# Patient Record
Sex: Female | Born: 1955
Health system: Southern US, Community
[De-identification: ages and names within clinical notes are randomized; demographics above are authoritative.]

## PROBLEM LIST (undated history)

## (undated) DIAGNOSIS — G4700 Insomnia, unspecified: Secondary | ICD-10-CM

## (undated) DIAGNOSIS — K219 Gastro-esophageal reflux disease without esophagitis: Secondary | ICD-10-CM

## (undated) DIAGNOSIS — M8580 Other specified disorders of bone density and structure, unspecified site: Secondary | ICD-10-CM

## (undated) DIAGNOSIS — M810 Age-related osteoporosis without current pathological fracture: Secondary | ICD-10-CM

## (undated) DIAGNOSIS — N6009 Solitary cyst of unspecified breast: Secondary | ICD-10-CM

## (undated) DIAGNOSIS — K589 Irritable bowel syndrome without diarrhea: Secondary | ICD-10-CM

## (undated) DIAGNOSIS — F419 Anxiety disorder, unspecified: Secondary | ICD-10-CM

## (undated) HISTORY — DX: Other specified disorders of bone density and structure, unspecified site: M85.80

## (undated) HISTORY — DX: Insomnia, unspecified: G47.00

## (undated) HISTORY — DX: Solitary cyst of unspecified breast: N60.09

## (undated) HISTORY — DX: Anxiety disorder, unspecified: F41.9

## (undated) HISTORY — DX: Gastro-esophageal reflux disease without esophagitis: K21.9

## (undated) HISTORY — DX: Age-related osteoporosis without current pathological fracture: M81.0

## (undated) HISTORY — DX: Irritable bowel syndrome, unspecified: K58.9

---

## 1983-10-04 HISTORY — PX: TUBAL LIGATION: SHX77

## 1999-12-27 HISTORY — PX: BREAST BIOPSY: SHX20

## 2002-09-13 HISTORY — PX: COLPOSCOPY: SHX161

## 2005-05-25 ENCOUNTER — Ambulatory Visit: Payer: Self-pay | Admitting: Internal Medicine

## 2005-06-09 ENCOUNTER — Ambulatory Visit: Payer: Self-pay | Admitting: Surgery

## 2005-06-21 ENCOUNTER — Ambulatory Visit: Payer: Self-pay | Admitting: Surgery

## 2005-09-28 ENCOUNTER — Ambulatory Visit: Payer: Self-pay | Admitting: Otolaryngology

## 2007-12-27 HISTORY — PX: COLONOSCOPY: SHX174

## 2008-03-17 ENCOUNTER — Ambulatory Visit: Payer: Self-pay | Admitting: Unknown Physician Specialty

## 2009-01-06 ENCOUNTER — Ambulatory Visit: Payer: Self-pay | Admitting: Unknown Physician Specialty

## 2009-03-26 HISTORY — PX: CHOLECYSTECTOMY: SHX55

## 2009-04-08 ENCOUNTER — Emergency Department: Payer: Self-pay | Admitting: Emergency Medicine

## 2009-04-16 ENCOUNTER — Ambulatory Visit: Payer: Self-pay | Admitting: General Surgery

## 2009-04-20 ENCOUNTER — Ambulatory Visit: Payer: Self-pay | Admitting: General Surgery

## 2010-01-19 ENCOUNTER — Ambulatory Visit: Payer: Self-pay | Admitting: Unknown Physician Specialty

## 2011-02-01 ENCOUNTER — Ambulatory Visit: Payer: Self-pay | Admitting: Unknown Physician Specialty

## 2011-12-28 ENCOUNTER — Ambulatory Visit: Payer: Self-pay | Admitting: Unknown Physician Specialty

## 2012-02-06 ENCOUNTER — Ambulatory Visit: Payer: Self-pay | Admitting: Unknown Physician Specialty

## 2014-01-15 ENCOUNTER — Ambulatory Visit: Payer: Self-pay | Admitting: Unknown Physician Specialty

## 2014-10-06 ENCOUNTER — Other Ambulatory Visit: Payer: Self-pay | Admitting: *Deleted

## 2014-10-06 ENCOUNTER — Encounter: Payer: Self-pay | Admitting: Podiatry

## 2014-10-06 ENCOUNTER — Ambulatory Visit (INDEPENDENT_AMBULATORY_CARE_PROVIDER_SITE_OTHER): Payer: BC Managed Care – PPO | Admitting: Podiatry

## 2014-10-06 ENCOUNTER — Ambulatory Visit (INDEPENDENT_AMBULATORY_CARE_PROVIDER_SITE_OTHER): Payer: BC Managed Care – PPO

## 2014-10-06 VITALS — BP 117/75 | HR 66 | Resp 16 | Ht 63.5 in | Wt 115.0 lb

## 2014-10-06 DIAGNOSIS — M205X9 Other deformities of toe(s) (acquired), unspecified foot: Secondary | ICD-10-CM

## 2014-10-06 DIAGNOSIS — M21629 Bunionette of unspecified foot: Secondary | ICD-10-CM

## 2014-10-06 DIAGNOSIS — M2010 Hallux valgus (acquired), unspecified foot: Secondary | ICD-10-CM

## 2014-10-06 DIAGNOSIS — L603 Nail dystrophy: Secondary | ICD-10-CM

## 2014-10-06 NOTE — Progress Notes (Signed)
   Subjective:    Patient ID: Susan QuanVirginia M Chaney, female    DOB: [birth year], 58 y.o.   MRN: [id number]  HPI Comments: I have two big bunions, also i would like to talk to about the laser treatment for nail fungus. I have dealt with fungus for years and used different therapies it would help and it would just come back .  My bunions have got worse over years and hurt with certain types of shoes      Review of Systems  All other systems reviewed and are negative.      Objective:   Physical Exam: Her past medical history medications allergies surgeries and social history. Pulses are strongly palpable bilateral. Neurologic sensorium is intact per Semmes-Weinstein monofilament. Degenerative flexor intact bilateral muscle strength is 5 over 5 dorsiflexors plantar flexors inverters everters all intrinsic musculature is intact. Orthopedic evaluation demonstrates all joints distal to the ankle a full range of motion without crepitation. She has moderate to severe hallux abductovalgus deformity right greater than left. With medial deviation of the toes. She has mild hammertoe deformities #2 bilateral. Radiographic evaluation demonstrates Taylor's bunion deformity with an increase in the fourth intermetatarsal angle as well as hallux abductovalgus deformity with increase in the first intermetatarsal angle both of these angle greater than normal values with abduction and abduction of the toes respectively. Cutaneous evaluation demonstrates supple well hydrated cutis thick yellow dystrophic possibly mycotic nails 12 and 3 bilateral.        Assessment & Plan:  Assessment: Nail dystrophy toes #1 toes #2 toe #3 bilateral. Hallux abductovalgus deformity right greater than left with pain. Taylor's bunion deformity right greater than left with pain.  Plan: Discussed etiology pathology conservative versus surgical therapies. I took a sample of the nail today do sent for pathologic evaluation. She was also  dispensed a Cam Walker today for postoperative. The consent form was signed today after we reviewed consisting of an Austin bunion repair right foot and a fifth metatarsal osteotomy with screw right foot I answered all the questions regarding his procedures to the best of my ability in layman's terms. We did discuss a possible postop complications which may include but are not limited to postop pain bleeding swelling infection need further surgery over correction under correction loss a digital also limb loss of life. I answered these to the best my ability. I will followup with her in the near future for surgical intervention.

## 2014-10-22 ENCOUNTER — Encounter: Payer: Self-pay | Admitting: Podiatry

## 2014-11-05 ENCOUNTER — Ambulatory Visit (INDEPENDENT_AMBULATORY_CARE_PROVIDER_SITE_OTHER): Payer: BC Managed Care – PPO | Admitting: Podiatry

## 2014-11-05 VITALS — BP 115/71 | HR 74 | Resp 16

## 2014-11-05 DIAGNOSIS — Z79899 Other long term (current) drug therapy: Secondary | ICD-10-CM

## 2014-11-05 MED ORDER — TERBINAFINE HCL 250 MG PO TABS: 250.0000 mg | ORAL_TABLET | Freq: Every day | ORAL | Status: DC

## 2014-11-05 NOTE — Progress Notes (Signed)
She presents today for her pathology results regarding her toenail fungus. He came back positive for a simple fungus. She states that she has had a history of loss of taste with the use of antifungal such as Lamisil. However she states that she cannot afford Sporanox.  Objective: Onychomycosis bilateral with with tinea pedis.  Assessment: Onychomycosis tinea pedis bilateral.  Plan: She is willing to take the risk to use Lamisil. Should she start to develop loss of taste she will discontinue that he medication. We will start her on Lamisil 250 mg tablets 1 by mouth daily and a liver profile was requested. Should this come back abnormal I will notify her otherwise I will see her in 1 month.

## 2014-12-03 ENCOUNTER — Ambulatory Visit: Payer: BC Managed Care – PPO | Admitting: Podiatry

## 2014-12-30 ENCOUNTER — Telehealth: Payer: Self-pay | Admitting: *Deleted

## 2014-12-30 NOTE — Telephone Encounter (Signed)
Pt states she would like to schedule surgery for 02/20/2015 with Dr. Al CorpusHyatt.

## 2015-02-18 ENCOUNTER — Other Ambulatory Visit: Payer: Self-pay | Admitting: Podiatry

## 2015-02-18 MED ORDER — CEPHALEXIN 500 MG PO CAPS: 500.0000 mg | ORAL_CAPSULE | Freq: Three times a day (TID) | ORAL | Status: DC

## 2015-02-18 MED ORDER — PROMETHAZINE HCL 25 MG PO TABS: 25.0000 mg | ORAL_TABLET | Freq: Three times a day (TID) | ORAL | Status: DC | PRN

## 2015-02-18 MED ORDER — OXYCODONE-ACETAMINOPHEN 10-325 MG PO TABS: ORAL_TABLET | ORAL | Status: DC

## 2015-02-20 ENCOUNTER — Encounter: Payer: Self-pay | Admitting: Podiatry

## 2015-02-20 DIAGNOSIS — M2011 Hallux valgus (acquired), right foot: Secondary | ICD-10-CM

## 2015-02-20 DIAGNOSIS — M21541 Acquired clubfoot, right foot: Secondary | ICD-10-CM

## 2015-02-25 ENCOUNTER — Ambulatory Visit (INDEPENDENT_AMBULATORY_CARE_PROVIDER_SITE_OTHER): Payer: BLUE CROSS/BLUE SHIELD

## 2015-02-25 ENCOUNTER — Ambulatory Visit (INDEPENDENT_AMBULATORY_CARE_PROVIDER_SITE_OTHER): Payer: BLUE CROSS/BLUE SHIELD | Admitting: Podiatry

## 2015-02-25 VITALS — BP 120/84 | HR 75 | Temp 96.3°F | Resp 16

## 2015-02-25 DIAGNOSIS — Z9889 Other specified postprocedural states: Secondary | ICD-10-CM

## 2015-02-25 DIAGNOSIS — M205X9 Other deformities of toe(s) (acquired), unspecified foot: Secondary | ICD-10-CM

## 2015-02-25 DIAGNOSIS — M2011 Hallux valgus (acquired), right foot: Secondary | ICD-10-CM | POA: Diagnosis not present

## 2015-02-25 DIAGNOSIS — M21629 Bunionette of unspecified foot: Secondary | ICD-10-CM

## 2015-02-26 NOTE — Progress Notes (Signed)
She presents today 1 week status post ostomy repair right fifth metatarsal osteotomy right date of surgery was 02/20/2015. She states that it feels pretty good but it's a little swollen.  Objective: Vital signs are stable she is alert and oriented 3 presents today in her Cam Walker. Once removed in this dressing dry sterile dressing intact once removed demonstrates moderate edema overlying the midfoot and forefoot with some irritation of the skin around the ankle secondary to dressing. She has good range of motion of the first metatarsophalangeal joint with some tenderness. I see no signs of infection and radiographically the osteotomies are in good position. Internal fixation is intact.  Assessment: Well-healing surgical toes status post Austin bunion repair and fifth metatarsal osteotomy right foot.  Plan: Redress today dressed press dressing follow-up with her in 1 week for suture removal if applicable. She will continue to keep this dry cleaning elevated.

## 2015-03-04 ENCOUNTER — Encounter: Payer: Self-pay | Admitting: Podiatry

## 2015-03-04 ENCOUNTER — Ambulatory Visit (INDEPENDENT_AMBULATORY_CARE_PROVIDER_SITE_OTHER): Payer: BLUE CROSS/BLUE SHIELD | Admitting: Podiatry

## 2015-03-04 VITALS — BP 118/71 | HR 65 | Resp 16

## 2015-03-04 DIAGNOSIS — Z9889 Other specified postprocedural states: Secondary | ICD-10-CM

## 2015-03-04 DIAGNOSIS — M205X1 Other deformities of toe(s) (acquired), right foot: Secondary | ICD-10-CM

## 2015-03-04 DIAGNOSIS — M2011 Hallux valgus (acquired), right foot: Secondary | ICD-10-CM | POA: Diagnosis not present

## 2015-03-04 NOTE — Progress Notes (Signed)
She presents today for her second postop visit [facility] fifth metatarsal osteotomy right foot. She states that the distalmost aspect of the incision site appears to be a little bit tender but all nausea and doing quite well.  Objective: Vital signs are stable she is alert and oriented 3. Pulses are strongly palpable bilateral. Neurologic sensorium is intact. Margins appear to be well coapted there is minimal edema and sutures were removed today. She has some anticipation about range of motion of the first metatarsophalangeal joint of the right foot.  Assessment: Well-healing surgical foot right.  Plan: Continue physical therapy daily at home and she may start washing this regularly and I will follow-up with her in 2 weeks. She was dispensed a Darco shoe today with compression anklet.

## 2015-03-06 ENCOUNTER — Other Ambulatory Visit: Payer: Self-pay | Admitting: Podiatry

## 2015-03-06 MED ORDER — CEPHALEXIN 500 MG PO CAPS: 500.0000 mg | ORAL_CAPSULE | Freq: Three times a day (TID) | ORAL | Status: DC

## 2015-03-06 NOTE — Progress Notes (Signed)
Pt called stating that her incision area had become red and inflamed and slightly tender again, she stated that she was previously prescribed Keflex 500mg  TID x 10 days and was advised to call if area became red and inflamed. Additional antibiotic was written and she was scheduled to be seen next week for evaluation. Should symptoms worsen she is to call the office or seek medical attention.

## 2015-03-09 ENCOUNTER — Encounter: Payer: BLUE CROSS/BLUE SHIELD | Admitting: Podiatry

## 2015-03-11 ENCOUNTER — Encounter: Payer: Self-pay | Admitting: Podiatry

## 2015-03-11 ENCOUNTER — Ambulatory Visit (INDEPENDENT_AMBULATORY_CARE_PROVIDER_SITE_OTHER): Payer: BLUE CROSS/BLUE SHIELD | Admitting: Podiatry

## 2015-03-11 VITALS — BP 108/68 | HR 63 | Resp 16

## 2015-03-11 DIAGNOSIS — M2011 Hallux valgus (acquired), right foot: Secondary | ICD-10-CM

## 2015-03-11 DIAGNOSIS — Z9889 Other specified postprocedural states: Secondary | ICD-10-CM

## 2015-03-11 DIAGNOSIS — M205X1 Other deformities of toe(s) (acquired), right foot: Secondary | ICD-10-CM

## 2015-03-11 NOTE — Progress Notes (Signed)
She presents today 3 weeks status post Austin bunion repair and fifth metatarsal osteotomy right foot. She states this seems to be doing much better.  Objective: Vital signs are stable she is alert and oriented 3. Pulses are palpable right lower extremity. Much decrease in edema there is no erythema cellulitis drainage or odor. She has great range of motion of the first metatarsophalangeal joint with dry skin overlying the incision site. There is no open wound.  Assessment: Well-healing surgical foot right.  Plan: Encouraged range of motion exercises soaking therapy and massage therapy. I will follow up with her in 1 week.

## 2015-03-18 ENCOUNTER — Ambulatory Visit (INDEPENDENT_AMBULATORY_CARE_PROVIDER_SITE_OTHER): Payer: BLUE CROSS/BLUE SHIELD | Admitting: Podiatry

## 2015-03-18 ENCOUNTER — Ambulatory Visit (INDEPENDENT_AMBULATORY_CARE_PROVIDER_SITE_OTHER): Payer: BLUE CROSS/BLUE SHIELD

## 2015-03-18 DIAGNOSIS — Z9889 Other specified postprocedural states: Secondary | ICD-10-CM

## 2015-03-18 DIAGNOSIS — M2011 Hallux valgus (acquired), right foot: Secondary | ICD-10-CM

## 2015-03-18 NOTE — Progress Notes (Signed)
DOS 02/20/2015 Austin Bunion Repair right foot with Screw, Tailor's Bunion Repair (5th Metatarsal Osteotomy with screw) right foot.

## 2015-03-19 NOTE — Progress Notes (Signed)
She presents today for a postop bunion and fifth metatarsal osteotomy times approximately 4 weeks at this point. She states that she is doing very well however she knows that she overdid it this past Monday. She denies any trauma to the foot other than being on it too much.  Objective: Vital signs are stable she is alert and oriented 3. Pulses are strongly palpable. She has great range of motion of the first metatarsophalangeal joint and radiographs confirm osteotomies are healing.  Assessment: Well-healing first and fifth metatarsal osteotomies right foot.  Plan: Encourage range of motion exercises and would allow her to get back into a loose fitting pair of tennis shoes or she will continue to wear the Darco. I will follow-up with her in 2-3 weeks for another set of x-rays and hopefully release at that time.

## 2015-04-01 ENCOUNTER — Ambulatory Visit (INDEPENDENT_AMBULATORY_CARE_PROVIDER_SITE_OTHER): Payer: BLUE CROSS/BLUE SHIELD | Admitting: Podiatry

## 2015-04-01 ENCOUNTER — Encounter: Payer: Self-pay | Admitting: Podiatry

## 2015-04-01 ENCOUNTER — Ambulatory Visit (INDEPENDENT_AMBULATORY_CARE_PROVIDER_SITE_OTHER): Payer: BLUE CROSS/BLUE SHIELD

## 2015-04-01 VITALS — BP 131/83 | HR 73 | Resp 16

## 2015-04-01 DIAGNOSIS — M205X1 Other deformities of toe(s) (acquired), right foot: Secondary | ICD-10-CM

## 2015-04-01 DIAGNOSIS — M2011 Hallux valgus (acquired), right foot: Secondary | ICD-10-CM | POA: Diagnosis not present

## 2015-04-01 DIAGNOSIS — Z9889 Other specified postprocedural states: Secondary | ICD-10-CM

## 2015-04-01 NOTE — Progress Notes (Signed)
She presents today for follow-up of [facility] and fifth metatarsal osteotomy right foot. She states that they're doing great. Her date of surgery was February 26 of 2016.  Objective: Vital signs are stable she is alert and oriented 3 there is no erythema edema saline as drainage or odor she has great range of motion of the first metatarsophalangeal joint. No tenderness on palpation or range of motion. Radiographic evaluation demonstrates osteotomies have gone on the heal uneventfully. Internal fixation is in good position.  Assessment: Well-healing surgical foot right.  Plan: Get back to her regular routine follow-up with me in 1 month for release.

## 2015-04-29 ENCOUNTER — Encounter: Payer: BLUE CROSS/BLUE SHIELD | Admitting: Podiatry

## 2015-05-04 ENCOUNTER — Ambulatory Visit (INDEPENDENT_AMBULATORY_CARE_PROVIDER_SITE_OTHER): Payer: BLUE CROSS/BLUE SHIELD

## 2015-05-04 ENCOUNTER — Encounter: Payer: Self-pay | Admitting: Podiatry

## 2015-05-04 ENCOUNTER — Ambulatory Visit: Payer: Self-pay

## 2015-05-04 ENCOUNTER — Ambulatory Visit (INDEPENDENT_AMBULATORY_CARE_PROVIDER_SITE_OTHER): Payer: BLUE CROSS/BLUE SHIELD | Admitting: Podiatry

## 2015-05-04 VITALS — BP 121/77 | HR 69 | Resp 16

## 2015-05-04 DIAGNOSIS — Z9889 Other specified postprocedural states: Secondary | ICD-10-CM

## 2015-05-04 DIAGNOSIS — M205X1 Other deformities of toe(s) (acquired), right foot: Secondary | ICD-10-CM

## 2015-05-04 DIAGNOSIS — M2011 Hallux valgus (acquired), right foot: Secondary | ICD-10-CM

## 2015-05-05 NOTE — Progress Notes (Signed)
She presents today for postop visit date of surgery 02/20/2015. She denies fever chills nausea vomiting muscle aches and pains. States that is doing great.  Objective: Vital signs are stable she is alert and oriented 3. Pulses are strongly palpable bilaterally. She has minimal edema to the right foot. Full range of motion of the first and fifth metatarsophalangeal joints of the right foot no pain on palpation. Radiographic evaluation confirms consolidated osteotomies with internal fixation intact first and fifth right foot.  Assessment: Well-healing Austin bunion repair right and fifth metatarsal osteotomy right.  Plan: She is released from surgical care today. She will follow up with us as needed

## 2015-05-07 ENCOUNTER — Other Ambulatory Visit: Payer: Self-pay | Admitting: Internal Medicine

## 2015-05-07 DIAGNOSIS — Z1231 Encounter for screening mammogram for malignant neoplasm of breast: Secondary | ICD-10-CM

## 2015-05-07 DIAGNOSIS — M818 Other osteoporosis without current pathological fracture: Secondary | ICD-10-CM | POA: Insufficient documentation

## 2015-05-19 ENCOUNTER — Ambulatory Visit: Payer: Self-pay

## 2015-05-27 ENCOUNTER — Ambulatory Visit
Admission: RE | Admit: 2015-05-27 | Discharge: 2015-05-27 | Disposition: A | Discharge status: Home / Self Care | Payer: BLUE CROSS/BLUE SHIELD | Source: Ambulatory Visit | Attending: Internal Medicine | Admitting: Internal Medicine

## 2015-05-27 DIAGNOSIS — Z1231 Encounter for screening mammogram for malignant neoplasm of breast: Secondary | ICD-10-CM

## 2016-05-11 DIAGNOSIS — E538 Deficiency of other specified B group vitamins: Secondary | ICD-10-CM | POA: Insufficient documentation

## 2016-05-12 ENCOUNTER — Other Ambulatory Visit: Payer: Self-pay | Admitting: Internal Medicine

## 2016-05-12 DIAGNOSIS — Z1231 Encounter for screening mammogram for malignant neoplasm of breast: Secondary | ICD-10-CM

## 2016-06-06 ENCOUNTER — Other Ambulatory Visit: Payer: Self-pay | Admitting: Internal Medicine

## 2016-06-06 ENCOUNTER — Ambulatory Visit
Admission: RE | Admit: 2016-06-06 | Discharge: 2016-06-06 | Disposition: A | Discharge status: Home / Self Care | Payer: BLUE CROSS/BLUE SHIELD | Source: Ambulatory Visit | Attending: Internal Medicine | Admitting: Internal Medicine

## 2016-06-06 DIAGNOSIS — Z1231 Encounter for screening mammogram for malignant neoplasm of breast: Secondary | ICD-10-CM | POA: Diagnosis not present

## 2016-10-24 ENCOUNTER — Ambulatory Visit: Payer: BLUE CROSS/BLUE SHIELD | Admitting: Podiatry

## 2016-10-31 ENCOUNTER — Ambulatory Visit (INDEPENDENT_AMBULATORY_CARE_PROVIDER_SITE_OTHER): Payer: BLUE CROSS/BLUE SHIELD

## 2016-10-31 ENCOUNTER — Ambulatory Visit (INDEPENDENT_AMBULATORY_CARE_PROVIDER_SITE_OTHER): Payer: BLUE CROSS/BLUE SHIELD | Admitting: Podiatry

## 2016-10-31 DIAGNOSIS — B351 Tinea unguium: Secondary | ICD-10-CM

## 2016-10-31 DIAGNOSIS — M79672 Pain in left foot: Secondary | ICD-10-CM

## 2016-10-31 DIAGNOSIS — M21622 Bunionette of left foot: Secondary | ICD-10-CM

## 2016-10-31 DIAGNOSIS — M2012 Hallux valgus (acquired), left foot: Secondary | ICD-10-CM | POA: Diagnosis not present

## 2016-10-31 DIAGNOSIS — M79676 Pain in unspecified toe(s): Secondary | ICD-10-CM | POA: Diagnosis not present

## 2016-10-31 NOTE — Patient Instructions (Signed)
Pre-Operative Instructions  Congratulations, you have decided to take an important step to improving your quality of life.  You can be assured that the doctors of [facility] will be with you every step of the way.  1. Plan to be at the surgery center/hospital at least 1 (one) hour prior to your scheduled time unless otherwise directed by the surgical center/hospital staff.  You must have a responsible adult accompany you, remain during the surgery and drive you home.  Make sure you have directions to the surgical center/hospital and know how to get there on time. 2. For hospital based surgery you will need to obtain a history and physical form from your family physician within 1 month prior to the date of surgery- we will give you a form for you primary physician.  3. We make every effort to accommodate the date you request for surgery.  There are however, times where surgery dates or times have to be moved.  We will contact you as soon as possible if a change in schedule is required.   4. No Aspirin/Ibuprofen for one week before surgery.  If you are on aspirin, any non-steroidal anti-inflammatory medications (Mobic, Aleve, Ibuprofen) you should stop taking it 7 days prior to your surgery.  You make take Tylenol  For pain prior to surgery.  5. Medications- If you are taking daily heart and blood pressure medications, seizure, reflux, allergy, asthma, anxiety, pain or diabetes medications, make sure the surgery center/hospital is aware before the day of surgery so they may notify you which medications to take or avoid the day of surgery. 6. No food or drink after midnight the night before surgery unless directed otherwise by surgical center/hospital staff. 7. No alcoholic beverages 24 hours prior to surgery.  No smoking 24 hours prior to or 24 hours after surgery. 8. Wear loose pants or shorts- loose enough to fit over bandages, boots, and casts. 9. No slip on shoes, sneakers are best. 10. Bring  your boot with you to the surgery center/hospital.  Also bring crutches or a walker if your physician has prescribed it for you.  If you do not have this equipment, it will be provided for you after surgery. 11. If you have not been contracted by the surgery center/hospital by the day before your surgery, call to confirm the date and time of your surgery. 12. Leave-time from work may vary depending on the type of surgery you have.  Appropriate arrangements should be made prior to surgery with your employer. 13. Prescriptions will be provided immediately following surgery by your doctor.  Have these filled as soon as possible after surgery and take the medication as directed. 14. Remove nail polish on the operative foot. 15. Wash the night before surgery.  The night before surgery wash the foot and leg well with the antibacterial soap provided and water paying special attention to beneath the toenails and in between the toes.  Rinse thoroughly with water and dry well with a towel.  Perform this wash unless told not to do so by your physician.  Enclosed: 1 Ice pack (please put in freezer the night before surgery)   1 Hibiclens skin cleaner   Pre-op Instructions  If you have any questions regarding the instructions, do not hesitate to call our office.  [place]: [address] [place], [place] [address] [phone]  [place]: [address] [place], [place] [address] [phone]  [place]: [address]  [place], [place] [address] [phone]   Dr.   Norman Regal DPM, Dr. Matthew Wagoner DPM, Dr. M. Todd Deneisha Dade DPM, Dr. Titorya Stover DPM 

## 2016-10-31 NOTE — Progress Notes (Signed)
She presents today for a surgical consult regarding to her left foot. She states that the left foot is very painful and she like to have it surgically corrected as she did the right foot. She is also considering laser therapy for toenail fungus.  Objective: Vital signs are stable she is alert and oriented 3. Pulses are palpable. She has exquisite pain on range of motion and palpation of the first and fifth metatarsophalangeal joints of the left foot. There is overlying edema no cellulitis drainage or odor. Mild tenderness on range of motion. Radiographs 3 views taken of the left foot today demonstrates an increase in the first intermetatarsal angle with hallux abductus angle greater than normal value. Early dislocation is noted early joint space narrowing with subchondral sclerosis indicative osteoarthritis is occurring. Lateral bowing to the fifth metatarsal of the left foot with abduction of the fifth toe toward the fourth toe resulting in tailor's bunion deformity.  Cutaneous evaluation of his wrist onychomycosis.  Assessment: Pain in limb secondary to hallux abductovalgus deformity tailor's bunion deformity and onychomycosis.  Plan: We discussed the etiology pathology conservative versus surgical therapies. At this point we consented her for [facility] with screw fixation left and a fifth metatarsal osteotomy with screw fixation left. I answered all the questions regarding these procedures to the best mobility in layman's terms. She understood this was amenable to a +03 patient the consent form. We did discuss the day today in great detail hospital complications. She understands this is amenable to it. We also [facility].

## 2016-11-08 ENCOUNTER — Ambulatory Visit (INDEPENDENT_AMBULATORY_CARE_PROVIDER_SITE_OTHER): Payer: BLUE CROSS/BLUE SHIELD

## 2016-11-08 DIAGNOSIS — B351 Tinea unguium: Secondary | ICD-10-CM

## 2016-11-08 DIAGNOSIS — M79676 Pain in unspecified toe(s): Secondary | ICD-10-CM

## 2016-11-09 NOTE — Progress Notes (Signed)
Pt presents with mycotic infection of nails 1-5 bilateral  All other systems are negative  Laser therapy administered to affected nails and tolerated well. All safety precautions were in place. Re-appointed in 1 month for 2 of possible 6 treatments

## 2016-12-06 ENCOUNTER — Ambulatory Visit: Payer: Self-pay | Admitting: Podiatry

## 2016-12-06 DIAGNOSIS — B351 Tinea unguium: Secondary | ICD-10-CM

## 2016-12-06 DIAGNOSIS — M79676 Pain in unspecified toe(s): Secondary | ICD-10-CM

## 2017-01-10 ENCOUNTER — Other Ambulatory Visit: Payer: BLUE CROSS/BLUE SHIELD

## 2017-01-17 ENCOUNTER — Other Ambulatory Visit: Payer: BLUE CROSS/BLUE SHIELD

## 2017-01-23 ENCOUNTER — Ambulatory Visit: Payer: Self-pay

## 2017-01-23 DIAGNOSIS — L608 Other nail disorders: Secondary | ICD-10-CM

## 2017-01-23 DIAGNOSIS — B351 Tinea unguium: Secondary | ICD-10-CM

## 2017-01-23 NOTE — Progress Notes (Signed)
Pt presents with mycotic infection of nails  All other systems are negative  Laser therapy administered to affected nails and tolerated well. All safety precautions were in place 

## 2017-01-30 NOTE — Progress Notes (Signed)
Pt presents with mycotic infection of nails  All other systems are negative  Laser therapy administered to affected nails and tolerated well. All safety precautions were in place. Re-appointed in 1 month for 4th treatment 

## 2017-02-21 ENCOUNTER — Ambulatory Visit: Payer: Self-pay

## 2017-02-21 DIAGNOSIS — B351 Tinea unguium: Secondary | ICD-10-CM

## 2017-02-21 DIAGNOSIS — M79676 Pain in unspecified toe(s): Principal | ICD-10-CM

## 2017-02-24 NOTE — Progress Notes (Signed)
Pt presents with mycotic infection of nails  All other systems are negative  Laser therapy administered to affected nails and tolerated well. All safety precautions were in place. Re-appointed in 1 month for 5th treatment

## 2017-03-23 ENCOUNTER — Ambulatory Visit: Payer: BLUE CROSS/BLUE SHIELD

## 2017-03-23 DIAGNOSIS — B351 Tinea unguium: Secondary | ICD-10-CM

## 2017-03-23 DIAGNOSIS — M79676 Pain in unspecified toe(s): Principal | ICD-10-CM

## 2017-03-30 NOTE — Progress Notes (Signed)
Pt presents with mycotic infection of nails  All other systems are negative  Laser therapy administered to affected nails and tolerated well. All safety precautions were in place. Re-appointed in 1 month for 6th treatment

## 2017-04-20 ENCOUNTER — Ambulatory Visit: Payer: BLUE CROSS/BLUE SHIELD | Admitting: Certified Nurse Midwife

## 2017-05-02 ENCOUNTER — Ambulatory Visit (INDEPENDENT_AMBULATORY_CARE_PROVIDER_SITE_OTHER): Payer: BLUE CROSS/BLUE SHIELD | Admitting: Podiatry

## 2017-05-02 DIAGNOSIS — B351 Tinea unguium: Secondary | ICD-10-CM

## 2017-05-02 NOTE — Progress Notes (Unsigned)
Pt presents with mycotic infection of nails  All other systems are negative  Laser therapy administered to affected nails and tolerated well. All safety precautions were in place. Re-appointed prn  

## 2017-05-03 ENCOUNTER — Telehealth: Payer: Self-pay | Admitting: *Deleted

## 2017-05-03 MED ORDER — NONFORMULARY OR COMPOUNDED ITEM: 2 refills | Status: DC

## 2017-05-03 NOTE — Telephone Encounter (Signed)
Dr. Charlsie Merlesegal ordered [facility] Onychomycosis Nail Lacquer. [facility]

## 2017-05-15 ENCOUNTER — Ambulatory Visit: Payer: BLUE CROSS/BLUE SHIELD

## 2017-06-06 ENCOUNTER — Other Ambulatory Visit: Payer: Self-pay | Admitting: Internal Medicine

## 2017-06-06 DIAGNOSIS — Z1231 Encounter for screening mammogram for malignant neoplasm of breast: Secondary | ICD-10-CM

## 2017-06-20 ENCOUNTER — Ambulatory Visit (INDEPENDENT_AMBULATORY_CARE_PROVIDER_SITE_OTHER): Payer: BLUE CROSS/BLUE SHIELD | Admitting: Certified Nurse Midwife

## 2017-06-20 ENCOUNTER — Encounter: Payer: Self-pay | Admitting: Certified Nurse Midwife

## 2017-06-20 ENCOUNTER — Ambulatory Visit
Admission: RE | Admit: 2017-06-20 | Discharge: 2017-06-20 | Disposition: A | Discharge status: Home / Self Care | Payer: BLUE CROSS/BLUE SHIELD | Source: Ambulatory Visit | Attending: Internal Medicine | Admitting: Internal Medicine

## 2017-06-20 VITALS — BP 110/70 | HR 74 | Ht 63.6 in | Wt 112.0 lb

## 2017-06-20 DIAGNOSIS — Z01419 Encounter for gynecological examination (general) (routine) without abnormal findings: Secondary | ICD-10-CM | POA: Diagnosis not present

## 2017-06-20 DIAGNOSIS — Z124 Encounter for screening for malignant neoplasm of cervix: Secondary | ICD-10-CM

## 2017-06-20 DIAGNOSIS — Z1231 Encounter for screening mammogram for malignant neoplasm of breast: Secondary | ICD-10-CM | POA: Diagnosis not present

## 2017-06-20 DIAGNOSIS — Z1211 Encounter for screening for malignant neoplasm of colon: Secondary | ICD-10-CM | POA: Diagnosis not present

## 2017-06-20 DIAGNOSIS — M8580 Other specified disorders of bone density and structure, unspecified site: Secondary | ICD-10-CM | POA: Insufficient documentation

## 2017-06-20 LAB — HEMOCCULT GUIAC POC 1CARD (OFFICE): Fecal Occult Blood, POC: NEGATIVE

## 2017-06-20 NOTE — Progress Notes (Signed)
Gynecology Annual Exam  PCP: Danella PentonMiller, Mark F, MD  Chief Complaint:  Chief Complaint  Patient presents with  . Gynecologic Exam    History of Present Illness: Susan Chaney is a 61 y.o.  Postmenopausal Caucasian female G2 18P1102 who presents today for her annual exam. The patient has no complaints today.  Her menses are absent. She has had no spotting Last pap smear: 10/20/2015, results were NIL/ neg HRHPV   The patient is sexually active. . She does not have dyspareunia.  Since her last visit, she has had no significant changes in her health. Her oldest daughter just had her third child and Tomeshia's fourth grandchild.  Her past medical history is remarkable for osteoporosis of her spine. She took Fosamax for a year and subsequent Dexa scans have shown improvement. Her last DEXA scan was this year and showed some osteopenia.  The patient does perform self breast exams. Her last mammogram was 06/07/2016, results were negative. .   There is a family history of breast cancer in her paternal aunt and her maternal cousins x 2 Genetic testing has not been done.  There is no family history of ovarian cancer.  Last colonoscopy was 2009, results: hemorrhoids. Next colonoscopy due 2019.  The patient denies smoking.  She reports rarely drinking alcohol.   She denies illegal drug use.  The patient reports exercising regularly.  The patient denies current symptoms of depression.    Review of Systems: Review of Systems  Constitutional: Negative for chills, fever and weight loss.  HENT: Negative for congestion, sinus pain and sore throat.   Eyes: Negative for blurred vision and pain.  Respiratory: Negative for hemoptysis, shortness of breath and wheezing.   Cardiovascular: Negative for chest pain, palpitations and leg swelling.  Gastrointestinal: Positive for diarrhea (occasionally, depending on diet). Negative for abdominal pain, blood in stool, heartburn, nausea and vomiting.   Genitourinary: Negative for dysuria, frequency, hematuria and urgency.  Musculoskeletal: Negative for back pain, joint pain and myalgias.  Skin: Negative for itching and rash.  Neurological: Negative for dizziness, tingling and headaches.  Endo/Heme/Allergies: Negative for environmental allergies and polydipsia. Does not bruise/bleed easily.       Negative for hirsutism   Psychiatric/Behavioral: Negative for depression. The patient is not nervous/anxious and does not have insomnia.     Past Medical History:  Past Medical History:  Diagnosis Date  . Acid reflux   . Anxiety   . IBS (irritable bowel syndrome)   . Insomnia   . Osteopenia   . Osteoporosis   . Solitary cyst of breast     Past Surgical History:  Past Surgical History:  Procedure Laterality Date  . BREAST BIOPSY Left 2001   Negative. 2 areas.   . CHOLECYSTECTOMY  03/2009  . COLONOSCOPY  2009   Dr Elliot/ hemorrhoids  . COLPOSCOPY  09/13/2002  . TUBAL LIGATION  10/04/1983    Family History:  Family History  Problem Relation Age of Onset  . Hypertension Mother   . Osteoporosis Mother   . Hyperlipidemia Mother   . Hypertension Father   . Breast cancer Paternal Aunt [id number]       post men.  . Diabetes Sister   . Colon cancer Maternal Grandfather   . Breast cancer Cousin        two cousins age 63 and 6845 (sisters)    Social History:  Social History   Social History  . Marital status: Married    Spouse  name: N/A  . Number of children: 2  . 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 Smoking status: Never Smoker  . Smokeless tobacco: Never Used  . Alcohol use Yes  . Drug use: No  . Sexual activity: Yes    Birth control/ protection: Post-menopausal   Other Topics Concern  . Not on file   Social History Narrative  . No narrative on file    Allergies:  Allergies  Allergen Reactions  . Codeine Nausea Only    Medications: Current  Outpatient Prescriptions:  .  ALPRAZolam (XANAX) 0.25 MG tablet, , Disp: , Rfl:  .  Calcium Carbonate-Vitamin D (CALTRATE 600+D) 600-400 MG-UNIT tablet, Take by mouth., Disp: , Rfl:  .  diphenoxylate-atropine (LOMOTIL) 2.5-0.025 MG tablet, , Disp: , Rfl:  .  NONFORMULARY OR COMPOUNDED ITEM, [facility]:  Onychomycosis Nail Lacquer - Fluconazole 2%, Terbinafine 1%, DMSO apply to affected area daily., Disp: 120 each, Rfl: 2 .  pantoprazole (PROTONIX) 40 MG tablet, Take by mouth., Disp: , Rfl:  .  temazepam (RESTORIL) 15 MG capsule, Take by mouth., Disp: , Rfl:  .  vitamin B-12 (CYANOCOBALAMIN) 1000 MCG tablet, Take by mouth., Disp: , Rfl:    Physical Exam Vitals: BP 110/70   Pulse 74   Ht 5' 3.6" (1.615 m)   Wt 112 lb (50.8 kg)   BMI 19.47 kg/m   General: WF in NAD HEENT: normocephalic, anicteric Neck: no thyroid enlargement, no palpable nodules, no cervical lymphadenopathy  Pulmonary: No increased work of breathing, CTAB Cardiovascular: RRR, without murmur  Breast: Breast symmetrical, no tenderness, very fibroglandular breasts, no skin retraction present, no nipple discharge.  Nipples are inverted bilaterally (have been for several years). No axillary, infraclavicular or supraclavicular lymphadenopathy. Abdomen: Soft, non-tender, non-distended.  Umbilicus without lesions.  No hepatomegaly or masses palpable. No evidence of hernia. Genitourinary:  External: Normal external female genitalia.  Normal urethral meatus, normal Bartholin's and Skene's glands.    Vagina: Normal vaginal mucosa, no evidence of prolapse.    Cervix: Grossly normal in appearance, no bleeding, non-tender  Uterus: Anteverted, normal size, shape, and consistency, mobile, and non-tender  Adnexa: No adnexal masses, non-tender  Rectal: deferred  Lymphatic: no evidence of inguinal lymphadenopathy Extremities: no edema, erythema, or tenderness Neurologic: Grossly intact Psychiatric: mood appropriate, affect  full  Results for orders placed or performed in visit on 06/20/17 (from the past 24 hour(s))  POCT Occult Blood Stool     Status: Normal   Collection Time: 06/20/17  8:37 PM  Result Value Ref Range   Fecal Occult Blood, POC Negative Negative   Card #1 Date     Card #2 Fecal Occult Blod, POC     Card #2 Date     Card #3 Fecal Occult Blood, POC     Card #3 Date       Assessment: 61 y.o. well woman exam   Plan:   1) Breast cancer screening - recommend monthly self breast exam and annual mammograms. Mammogram is scheduled for today.  2) Cervical cancer screening - Pap was done.   3) Routine healthcare maintenance including cholesterol and diabetes screening managed by PCP. Last cholesterol panel was done 2018 and was normal.  4) Colon cancer screening-negative hemoccult today. Colonoscopy due next year.  5) DEXA scan UTD.   Farrel Conners, CNM

## 2017-06-23 LAB — IGP,RFX APTIMA HPV ALL PTH: PAP Smear Comment: 0

## 2017-09-07 ENCOUNTER — Encounter: Payer: Self-pay | Admitting: Obstetrics and Gynecology

## 2018-01-17 ENCOUNTER — Other Ambulatory Visit: Payer: Self-pay | Admitting: Orthopedic Surgery

## 2018-01-17 DIAGNOSIS — M67911 Unspecified disorder of synovium and tendon, right shoulder: Secondary | ICD-10-CM

## 2018-01-21 ENCOUNTER — Ambulatory Visit
Admission: RE | Admit: 2018-01-21 | Discharge: 2018-01-21 | Disposition: A | Discharge status: Home / Self Care | Payer: BLUE CROSS/BLUE SHIELD | Source: Ambulatory Visit | Attending: Orthopedic Surgery | Admitting: Orthopedic Surgery

## 2018-01-21 DIAGNOSIS — M67911 Unspecified disorder of synovium and tendon, right shoulder: Secondary | ICD-10-CM

## 2018-02-23 HISTORY — PX: SHOULDER SURGERY: SHX246

## 2018-03-22 ENCOUNTER — Ambulatory Visit: Payer: 59 | Attending: Orthopedic Surgery | Admitting: Physical Therapy

## 2018-03-22 ENCOUNTER — Encounter: Payer: Self-pay | Admitting: Physical Therapy

## 2018-03-22 DIAGNOSIS — M25612 Stiffness of left shoulder, not elsewhere classified: Secondary | ICD-10-CM | POA: Diagnosis present

## 2018-03-22 NOTE — Therapy (Signed)
[place] [facility] [address] [place], [address] Phone: [phone]   Fax:  [phone]  Physical Therapy Evaluation  Patient Details  Name: Susan Chaney MRN: [id number] Date of Birth: 29-Aug-1956 Referring Provider: Jones Broom, MD   Encounter Date: 03/22/2018  PT End of Session - 03/22/18 1607    Visit Number  1    Number of Visits  17    Date for PT Re-Evaluation  05/03/18    PT Start Time  0246    PT Stop Time  0345    PT Time Calculation (min)  59 min    Equipment Utilized During Treatment  Other (comment) R soft shoulder sling    Activity Tolerance  Patient tolerated treatment well    Behavior During Therapy  [facility] for tasks assessed/performed       Past Medical History:  Diagnosis Date  . Acid reflux   . Anxiety   . IBS (irritable bowel syndrome)   . Insomnia   . Osteopenia   . Osteoporosis   . Solitary cyst of breast     Past Surgical History:  Procedure Laterality Date  . BREAST BIOPSY Left 2001   Negative. 2 areas.   . CHOLECYSTECTOMY  03/2009  . COLONOSCOPY  2009   Dr Elliot/ hemorrhoids  . COLPOSCOPY  09/13/2002  . TUBAL LIGATION  10/04/1983    There were no vitals filed for this visit.  Subjective Assessment - 03/22/18 1456    Pertinent History  Patient is a pleasant year old female s/p R manipulation with capsular release 3/26 with supraspinatous repair, and subsequent finding of biceps tendinopathy. Patient reports she also having a nerve block 3/26 which she reports she is still having some numbess in the shoulder and pins and needles in the fingers. Patient reports surgeon instructed her to wear brace 3 days, other than to complete exercises (AAROM flexion/abd). Patient reports she returns to see surgeon Wed. Patient notes no pain since sugery    Limitations  House hold activities;Reading;Walking;Lifting    How long can you sit comfortably?  unlimited    How long can you  stand comfortably?  unlimited    How long can you walk comfortably?  unlimited    Diagnostic tests  MRI and ultrasound evidence of 90% partial thickness supraspinatus RCT and biceps tendinopathy     Patient Stated Goals  Return to [employer] classes, running    Currently in Pain?  Yes    Pain Score  0-No pain    Pain Location  Shoulder    Pain Orientation  Right    Pain Onset  In the past 7 days         [facility] PT Assessment - 03/22/18 0001      Assessment   Medical Diagnosis  s/p R manipulation with release    Referring Provider  Jones Broom, MD    Onset Date/Surgical Date  03/20/18    Hand Dominance  Left    Next MD Visit  -- 03/28/18    Prior Therapy  -- PT for hamstring strain successful      Balance Screen   Has the patient fallen in the past 6 months  No    Has the patient had a decrease in activity level because of a fear of falling?   No    Is the patient reluctant to leave their home because of a fear of falling?   No  Home Environment   Living Environment  -- no stairs, no difficulty navigating      Prior Function   Level of Independence  Independent    Vocation  -- Stage managerComputer    Vocation Requirements  -- Sitting    Leisure  -- Running, bootcamp, cooking, gardening            AROM Not tested on L shoulder d/t surgical precautions R shoulder and all cervical AROM wnl  PROM Shoulder flex L- R- 135d before pain Shoulder ext L- R- 71d before pain Shoulder abd L- R- 104d before pain Shoulder IR L- R- 59d before pain Shoulder ER L- R- 41d before pain All passive cervical and L shoulder PROM wnl    Strength R shoulder only Flex 5/5 Ext 5/5 ER 4+/5 IR 5/5 ABD 4+/5 Elbow flex: L 4/5 before pain in ant shoulder at prox biceps tendon R 5/5 Elbow Ext: 5/5 bilat Slightly diminished grip strength L>R  Ther-Ex -Pulleys 5x into flexion with 5 sec holds 5x into abd with 5sec holds -Cane assisted flex/ABD/ER/IR holding for 30sec in each direction  (added to HEP) -Pendulum exercise 30sec with extensive education on stress of exercise being passive using BW to move UE (added to HEP) -Scapular retractions 20x (added to HEP)   No data recorded                 PT Education - 03/22/18 1606    Education provided  Yes    Education Details  Patient was educated on diagnosis, anatomy and pathology involved, prognosis, role of PT, and was given an HEP, demonstrating exercise with proper form following verbal and tactile cues, and was given a paper hand out to continue exercise at home. Pt was educated on and agreed to plan of care.    Person(s) Educated  Patient    Methods  Explanation;Demonstration;Handout;Verbal cues    Comprehension  Verbal cues required;Returned demonstration;Verbalized understanding       PT Short Term Goals - 03/22/18 1608      PT SHORT TERM GOAL #1   Title   Pt will be independent with HEP in order to improve strength and ROM to improve function at home and work.    Time  6    Period  Weeks    Status  New        PT Long Term Goals - 03/22/18 1609      PT LONG TERM GOAL #1   Title   Patient will increase FOTO score to 70 to demonstrate predicted increase in functional mobility to complete ADLs    Baseline  3/28 42    Time  6    Period  Weeks    Status  New      PT LONG TERM GOAL #2   Title  Pt will decrease quick DASH score by at least 8% in order to demonstrate clinically significant reduction in disability.    Baseline  3/27 77.3%    Time  6    Period  Weeks    Status  New      PT LONG TERM GOAL #3   Title  Patient will demonstrate symmetrical L shoulder ROM wnl in order to complete ADLs    Baseline  3/28 minimal AROM (unable), PROM see eval note    Time  6    Period  Weeks    Status  New      PT LONG TERM GOAL #4   Title  Patient will demonstrate gross L shoulder strength symmetrical to R side in order to safely participate in bootcamp exercise classes    Baseline  3/28 unable  to test L; see R in eval note    Time  6    Period  Weeks    Status  New            Plan - 03/22/18 1618    Clinical Impression Statement  Pt is a 62 year old female s/p L shoulder manipulation capsular release c debridement, and supraspinatus repair. Current activity limitations in reaching, lifting/carrying, object manipulation, self-care ADLs e.g. dressing, grooming, bathing, homemaking activities e.g. cleaning; associated with impairments including: decreased A/PROM, decreased strength, R shoulder local nociceptive pain. Pt prognosis is enhanced by motivation to return to exercise/weightlifting and active lifestyle. pt will benefit fromskilled PT intervention to address the aforementioned impairments and activity limitations for best return to PLOF.    History and Personal Factors relevant to plan of care:  2 personal factors/comorbidities, 3 body systems/activity limitations/participation restrictions     Clinical Presentation  Evolving    Clinical Presentation due to:  objective tests and measures    Clinical Decision Making  Moderate    Rehab Potential  Excellent    Clinical Impairments Affecting Rehab Potential  (-) chronicity of sx, mulitple pain areas in L shoulder (+) motivation, active lifestyle, lack of other comorbidities    PT Frequency  2x / week    PT Duration  8 weeks    PT Treatment/Interventions  Electrical Stimulation;Ultrasound;Cryotherapy;Moist Heat;Iontophoresis 4mg /ml Dexamethasone;Neuromuscular re-education;Passive range of motion;Manual techniques;Dry needling;Functional mobility training;Therapeutic activities;Patient/family education;Taping;Therapeutic exercise    PT Next Visit Plan  Review HEP and goals, AAROM L shoulder, begin isometric strengthening as able    PT Home Exercise Plan  cane assisted ER/IR/ABD/flex and pendulum exercises    Consulted and Agree with Plan of Care  Patient       Patient will benefit from skilled therapeutic intervention in order  to improve the following deficits and impairments:  Increased fascial restricitons, Impaired sensation, Improper body mechanics, Pain, Postural dysfunction, Decreased mobility, Decreased endurance, Decreased range of motion, Decreased strength, Impaired UE functional use  Visit Diagnosis: Stiffness of left shoulder, not elsewhere classified     Problem List Patient Active Problem List   Diagnosis Date Noted  . Osteopenia 06/20/2017   Staci Acosta PT, DPT Staci Acosta 03/22/2018, 4:24 PM  [place] [facility] PHYSICAL AND SPORTS MEDICINE [address], [place], [address] Phone: [phone]   Fax:  [phone]  Name: Susan Chaney MRN: [id number] Date of Birth: 03-13-1956

## 2018-03-26 ENCOUNTER — Ambulatory Visit: Payer: 59 | Attending: Orthopedic Surgery | Admitting: Physical Therapy

## 2018-03-26 ENCOUNTER — Encounter: Payer: Self-pay | Admitting: Physical Therapy

## 2018-03-26 DIAGNOSIS — M25612 Stiffness of left shoulder, not elsewhere classified: Secondary | ICD-10-CM | POA: Insufficient documentation

## 2018-03-26 NOTE — Therapy (Signed)
[place] [facility]ICINE [address] [place], [address] Phone: [phone]   Fax:  [phone]  Physical Therapy Treatment  Patient Details  Name: Susan Chaney MRN: [id number] Date of Birth: 15-Oct-1956 Referring Provider: Jones Broom, MD   Encounter Date: 03/26/2018  PT End of Session - 03/26/18 1324    Visit Number  2    Number of Visits  17    Date for PT Re-Evaluation  05/03/18    PT Start Time  0100    PT Stop Time  0145    PT Time Calculation (min)  45 min    Activity Tolerance  Patient tolerated treatment well    Behavior During Therapy  [facility] for tasks assessed/performed       Past Medical History:  Diagnosis Date  . Acid reflux   . Anxiety   . IBS (irritable bowel syndrome)   . Insomnia   . Osteopenia   . Osteoporosis   . Solitary cyst of breast     Past Surgical History:  Procedure Laterality Date  . BREAST BIOPSY Left 2001   Negative. 2 areas.   . CHOLECYSTECTOMY  03/2009  . COLONOSCOPY  2009   Dr Elliot/ hemorrhoids  . COLPOSCOPY  09/13/2002  . TUBAL LIGATION  10/04/1983    There were no vitals filed for this visit.  Subjective Assessment - 03/26/18 1305    Subjective  Patient reports some soreness in the shoulder today, 1/10 pain. Patient reports she has started having some audible popping with flexion that she reports feels "good", and is quick to relieve. PT encouraged patient to inform her surgeon of this when she goes for follow up Wednesday, but encouraged patient that if this is not painful, to not let it make her too anxious, just continue moving in pain free ROM. Patient reports she has not worn the sling all weekend and has been very compliant with her HEP and has been walking some    Pertinent History  Patient is a pleasant year old female s/p R manipulation with capsular release 3/26 with supraspinatous repair, and subsequent finding of biceps tendinopathy. Patient reports she  also having a nerve block 3/26 which she reports she is still having some numbess in the shoulder and pins and needles in the fingers. Patient reports surgeon instructed her to wear brace 3 days, other than to complete exercises (AAROM flexion/abd). Patient reports she returns to see surgeon Wed. Patient notes no pain since sugery    Limitations  House hold activities;Reading;Walking;Lifting    How long can you sit comfortably?  unlimited    How long can you stand comfortably?  unlimited    How long can you walk comfortably?  unlimited    Diagnostic tests  MRI and ultrasound evidence of 90% partial thickness supraspinatus RCT and biceps tendinopathy     Patient Stated Goals  Return to bootcamp gym classes, running    Pain Onset  In the past 7 days          Ther-Ex -Pulleys into flexion x20 3sec holds -Pulleys into abduction x20 3sec holds -UE ranger flex/abd/ER 3x 20sec holds each position -Towel slides flexion/abd 10x e with 3-5sec holds -Cane assisted ER/IR 5x each with 20 sec holds  Manual PROM into ABD/flex/IR/ER, moving into tolerable end range with 5 sec hold and repeating with heavier focus on ER/IR  PT Education - 03/26/18 1311    Education provided  Yes    Education Details  Exercise form    Person(s) Educated  Patient    Methods  Explanation;Demonstration;Tactile cues;Verbal cues    Comprehension  Verbalized understanding;Returned demonstration;Verbal cues required;Tactile cues required       PT Short Term Goals - 03/22/18 1608      PT SHORT TERM GOAL #1   Title   Pt will be independent with HEP in order to improve strength and ROM to improve function at home and work.    Time  6    Period  Weeks    Status  New        PT Long Term Goals - 03/22/18 1609      PT LONG TERM GOAL #1   Title   Patient will increase FOTO score to 70 to demonstrate predicted increase in functional mobility to complete ADLs    Baseline  3/28 42     Time  6    Period  Weeks    Status  New      PT LONG TERM GOAL #2   Title  Pt will decrease quick DASH score by at least 8% in order to demonstrate clinically significant reduction in disability.    Baseline  3/27 77.3%    Time  6    Period  Weeks    Status  New      PT LONG TERM GOAL #3   Title  Patient will demonstrate symmetrical L shoulder ROM wnl in order to complete ADLs    Baseline  3/28 minimal AROM (unable), PROM see eval note    Time  6    Period  Weeks    Status  New      PT LONG TERM GOAL #4   Title  Patient will demonstrate gross L shoulder strength symmetrical to R side in order to safely participate in bootcamp exercise classes    Baseline  3/28 unable to test L; see R in eval note    Time  6    Period  Weeks    Status  New            Plan - 03/26/18 1455    Clinical Impression Statement  Pt is demonstrating increased motion today with AROM and PROM wnl in flexion/ext/abd, with some remaining deficit in IR and ER. Patient reports 6/10 with PROM into end range that goes back down to 1/10 following. PT reviewed HEP with patient and corrected form for AAROM IR. Pt spoke to Surgeon's PA, who cleared up confusion from patient about surgery, noting that she did not have a supraspinatous tear, only debridement, and that patient was cleared for all AAROM/PROM as tolerated. PT will continue ROM re-storation as able.     Clinical Impairments Affecting Rehab Potential  (-) chronicity of sx, mulitple pain areas in L shoulder (+) motivation, active lifestyle, lack of other comorbidities    PT Frequency  2x / week    PT Duration  8 weeks    PT Treatment/Interventions  Electrical Stimulation;Ultrasound;Cryotherapy;Moist Heat;Iontophoresis 4mg /ml Dexamethasone;Neuromuscular re-education;Passive range of motion;Manual techniques;Dry needling;Functional mobility training;Therapeutic activities;Patient/family education;Taping;Therapeutic exercise    PT Next Visit Plan    iontophoresis per MD for inflammation, AAROM L shoulder, begin isometric strengthening as able    PT Home Exercise Plan  cane assisted ER/IR/ABD/flex and pendulum exercises    Consulted and Agree with Plan of Care  Patient  Patient will benefit from skilled therapeutic intervention in order to improve the following deficits and impairments:  Increased fascial restricitons, Impaired sensation, Improper body mechanics, Pain, Postural dysfunction, Decreased mobility, Decreased endurance, Decreased range of motion, Decreased strength, Impaired UE functional use  Visit Diagnosis: Stiffness of left shoulder, not elsewhere classified     Problem List Patient Active Problem List   Diagnosis Date Noted  . Osteopenia 06/20/2017   Staci Acostahelsea Miller PT, DPT Staci Acostahelsea Miller 03/26/2018, 3:56 PM  [place] [facility] [address] [place], [place], [address] Phone: [phone]   Fax:  [phone]  Name: Shireen QuanVirginia M Seide MRN: [id number] Date of Birth: [birth year]

## 2018-03-27 ENCOUNTER — Encounter: Payer: 59 | Admitting: Physical Therapy

## 2018-03-30 ENCOUNTER — Encounter: Payer: Self-pay | Admitting: Physical Therapy

## 2018-03-30 ENCOUNTER — Ambulatory Visit: Payer: 59 | Admitting: Physical Therapy

## 2018-03-30 DIAGNOSIS — M25612 Stiffness of left shoulder, not elsewhere classified: Secondary | ICD-10-CM | POA: Diagnosis not present

## 2018-03-30 NOTE — Therapy (Signed)
[place] [facility] [address] [place], [place], [address] Phone: [phone]   Fax:  [phone]  Physical Therapy Treatment  Patient Details  Name: Susan Chaney MRN: [id number] Date of Birth: 1956-05-25 Referring Provider: Jones BroomJustin Chandler, MD   Encounter Date: 03/30/2018  PT End of Session - 03/30/18 0921    Visit Number  3    Number of Visits  17    Date for PT Re-Evaluation  05/03/18    PT Start Time  0913    PT Stop Time  1003    PT Time Calculation (min)  50 min    Activity Tolerance  Patient tolerated treatment well    Behavior During Therapy  [facility] for tasks assessed/performed       Past Medical History:  Diagnosis Date  . Acid reflux   . Anxiety   . IBS (irritable bowel syndrome)   . Insomnia   . Osteopenia   . Osteoporosis   . Solitary cyst of breast     Past Surgical History:  Procedure Laterality Date  . BREAST BIOPSY Left 2001   Negative. 2 areas.   . CHOLECYSTECTOMY  03/2009  . COLONOSCOPY  2009   Dr Elliot/ hemorrhoids  . COLPOSCOPY  09/13/2002  . TUBAL LIGATION  10/04/1983    There were no vitals filed for this visit.  Subjective Assessment - 03/30/18 0913    Subjective  Patient reports some stiffness in the shoulder, but no "real pain". Patient reports she went to the MD Wed, who reported that she had some inflammation in the shoulder, but that she was cleared to continue what she is able to do.     Pertinent History  Patient is a pleasant year old female s/p R manipulation with capsular release 3/26 with supraspinatous repair, and subsequent finding of biceps tendinopathy. Patient reports she also having a nerve block 3/26 which she reports she is still having some numbess in the shoulder and pins and needles in the fingers. Patient reports surgeon instructed her to wear brace 3 days, other than to complete exercises (AAROM flexion/abd). Patient reports she returns to see surgeon Wed.  Patient notes no pain since sugery    Limitations  House hold activities;Reading;Walking;Lifting    How long can you sit comfortably?  unlimited    How long can you stand comfortably?  unlimited    How long can you walk comfortably?  unlimited    Diagnostic tests  MRI and ultrasound evidence of 90% partial thickness supraspinatus RCT and biceps tendinopathy     Patient Stated Goals  Return to bootcamp gym classes, running    Pain Onset  In the past 7 days       Ther-Ex -Pulleys ; 20x abd; 20x flex -UE ranger flex 5x 15 sec holds; ER 5x 15sec holds -AAROM towel IR 10x 3-5 sec holds -AAROM cane ER 20x 3-5 sec holds -Scap retractions 20xad -Standing yellow tband rows 1x 10; red 2x 10 -Isometric ER/IR/ABD/ext/flex 2x 10 with 3-5 sec holds with cuing for true isometric without extra shoulder movements -Doorway pec stretch with low hand position as patient is reporting tightness "under the collar bone" and this stretch reproduces concordant sign 3x 30sec holds   Ice pack 10mins at end of session                     PT Education - 03/30/18 0921    Education provided  Yes  Education Details  Exercise form; inflammation education    Person(s) Educated  Patient    Methods  Explanation;Demonstration;Tactile cues;Verbal cues    Comprehension  Verbalized understanding;Returned demonstration;Verbal cues required       PT Short Term Goals - 03/22/18 1608      PT SHORT TERM GOAL #1   Title   Pt will be independent with HEP in order to improve strength and ROM to improve function at home and work.    Time  6    Period  Weeks    Status  New        PT Long Term Goals - 03/22/18 1609      PT LONG TERM GOAL #1   Title   Patient will increase FOTO score to 70 to demonstrate predicted increase in functional mobility to complete ADLs    Baseline  3/28 42    Time  6    Period  Weeks    Status  New      PT LONG TERM GOAL #2   Title  Pt will decrease quick DASH score by  at least 8% in order to demonstrate clinically significant reduction in disability.    Baseline  3/27 77.3%    Time  6    Period  Weeks    Status  New      PT LONG TERM GOAL #3   Title  Patient will demonstrate symmetrical L shoulder ROM wnl in order to complete ADLs    Baseline  3/28 minimal AROM (unable), PROM see eval note    Time  6    Period  Weeks    Status  New      PT LONG TERM GOAL #4   Title  Patient will demonstrate gross L shoulder strength symmetrical to R side in order to safely participate in bootcamp exercise classes    Baseline  3/28 unable to test L; see R in eval note    Time  6    Period  Weeks    Status  New            Plan - 03/30/18 0932    Clinical Impression Statement  Pt is continuing to increase ROM and have minimal pain. Patient reports she is having some inflammation according to imaging from MD, and PT advised her in use of cryotherapy at a time 3x/day, and agreed with PA recommendation of utilizing NSAIDs as needed for inflammation.  PT initiated  periscapular strengthening and shoulder isometrics today, which patient was able to complete correctly following cuing from PT, without pain. Patient was given ice following treatment to control inflammation. PT will continue to add in resistance and increase AROM as tolerated by patient    Rehab Potential  Excellent    Clinical Impairments Affecting Rehab Potential  (-) chronicity of sx, mulitple pain areas in L shoulder (+) motivation, active lifestyle, lack of other comorbidities    PT Treatment/Interventions  Electrical Stimulation;Ultrasound;Cryotherapy;Moist Heat;Iontophoresis 4mg /ml Dexamethasone;Neuromuscular re-education;Passive range of motion;Manual techniques;Dry needling;Functional mobility training;Therapeutic activities;Patient/family education;Taping;Therapeutic exercise    PT Next Visit Plan   iontophoresis per MD for inflammation, AAROM L shoulder, begin isometric strengthening as able     PT Home Exercise Plan  Isometrics 4/5; cane assisted ER/IR/ABD/flex and pendulum exercises    Consulted and Agree with Plan of Care  Patient       Patient will benefit from skilled therapeutic intervention in order to improve the following deficits and impairments:  Increased fascial  restricitons, Impaired sensation, Improper body mechanics, Pain, Postural dysfunction, Decreased mobility, Decreased endurance, Decreased range of motion, Decreased strength, Impaired UE functional use  Visit Diagnosis: Stiffness of left shoulder, not elsewhere classified     Problem List Patient Active Problem List   Diagnosis Date Noted  . Osteopenia 06/20/2017   Staci Acosta PT, DPT Staci Acosta 03/30/2018, 10:38 AM  [place] [facility] PHYSICAL AND SPORTS MEDICINE [address] [place], [address] Phone: [phone]   Fax:  [phone]  Name: Susan Chaney MRN: [id number] Date of Birth: [birth year]

## 2018-04-03 ENCOUNTER — Ambulatory Visit: Payer: 59 | Admitting: Physical Therapy

## 2018-04-03 ENCOUNTER — Encounter: Payer: Self-pay | Admitting: Physical Therapy

## 2018-04-03 DIAGNOSIS — M25612 Stiffness of left shoulder, not elsewhere classified: Secondary | ICD-10-CM

## 2018-04-03 NOTE — Therapy (Signed)
[place] [facility] [address], [place], [address] Phone: [phone]   Fax:  [phone]  Physical Therapy Treatment  Patient Details  Name: Susan Chaney MRN: [id number] Date of Birth: 1955-12-30 Referring Provider: Jones Broom, MD   Encounter Date: 04/03/2018  PT End of Session - 04/03/18 1355    Visit Number  4    Number of Visits  17    Date for PT Re-Evaluation  05/03/18    PT Start Time  0145    PT Stop Time  0235    PT Time Calculation (min)  50 min    Activity Tolerance  Patient tolerated treatment well    Behavior During Therapy  [facility] for tasks assessed/performed       Past Medical History:  Diagnosis Date  . Acid reflux   . Anxiety   . IBS (irritable bowel syndrome)   . Insomnia   . Osteopenia   . Osteoporosis   . Solitary cyst of breast     Past Surgical History:  Procedure Laterality Date  . BREAST BIOPSY Left 2001   Negative. 2 areas.   . CHOLECYSTECTOMY  03/2009  . COLONOSCOPY  2009   Dr Elliot/ hemorrhoids  . COLPOSCOPY  09/13/2002  . TUBAL LIGATION  10/04/1983    There were no vitals filed for this visit.  Subjective Assessment - 04/03/18 1350    Subjective  Patient reports at the end of the day yesterday she was having increased soreness in the R tricep area following her first day back at work. Patient reports she is icing but "not as much as she should" and using ibprofen for inflammation. Reports compliance with HEP    Pertinent History  Patient is a pleasant year old female s/p R manipulation with capsular release 3/26 with supraspinatous repair, and subsequent finding of biceps tendinopathy. Patient reports she also having a nerve block 3/26 which she reports she is still having some numbess in the shoulder and pins and needles in the fingers. Patient reports surgeon instructed her to wear brace 3 days, other than to complete exercises (AAROM flexion/abd). Patient reports  she returns to see surgeon Wed. Patient notes no pain since sugery    Limitations  House hold activities;Reading;Walking;Lifting    How long can you sit comfortably?  unlimited    How long can you stand comfortably?  unlimited    How long can you walk comfortably?  unlimited    Diagnostic tests  MRI and ultrasound evidence of 90% partial thickness supraspinatus RCT and biceps tendinopathy     Patient Stated Goals  Return to [employer] gym classes, running    Pain Onset  In the past 7 days         Ther-Ex -Pulleys AAROM flexion and abd x10e w/ 3-5 sec holds  -Towel assisted IR and ER 10x each 3-5 sec holds -seated scoot back tricep stretch 3x 30sec holds -Isometric shoulder flexion 10x 3-5sec holds (HEP review) -Isometric shoulder ext 10x 3-5sec holds (HEP review; though pt reports this was not on her HEP so re-printed) -Isometric shoulder IR 10x 3-5sec holds (HEP review) -Isometric shoulder ER and abd painful so discontinued and removed from HEP form further notice  Manual -STM and trigger point release to tricep muscle belly with noted release of muscle and patient reporting less pain following -PROM in all directions with focus on ER as this is most limited   PROM values post manual:  Flex  143d abd 145d IR 86d  ER 90d    Ice pack following session unbilled 10 mins                PT Education - 04/03/18 1355    Education provided  Yes    Education Details  Exercise form    Person(s) Educated  Patient    Methods  Explanation;Demonstration;Verbal cues    Comprehension  Verbalized understanding;Verbal cues required;Returned demonstration       PT Short Term Goals - 03/22/18 1608      PT SHORT TERM GOAL #1   Title   Pt will be independent with HEP in order to improve strength and ROM to improve function at home and work.    Time  6    Period  Weeks    Status  New        PT Long Term Goals - 03/22/18 1609      PT LONG TERM GOAL #1   Title   Patient  will increase FOTO score to 70 to demonstrate predicted increase in functional mobility to complete ADLs    Baseline  3/28 42    Time  6    Period  Weeks    Status  New      PT LONG TERM GOAL #2   Title  Pt will decrease quick DASH score by at least 8% in order to demonstrate clinically significant reduction in disability.    Baseline  3/27 77.3%    Time  6    Period  Weeks    Status  New      PT LONG TERM GOAL #3   Title  Patient will demonstrate symmetrical L shoulder ROM wnl in order to complete ADLs    Baseline  3/28 minimal AROM (unable), PROM see eval note    Time  6    Period  Weeks    Status  New      PT LONG TERM GOAL #4   Title  Patient will demonstrate gross L shoulder strength symmetrical to R side in order to safely participate in bootcamp exercise classes    Baseline  3/28 unable to test L; see R in eval note    Time  6    Period  Weeks    Status  New            Plan - 04/03/18 1428    Clinical Impression Statement  Pt has full PROM, near full AAROM, and active motion slightly limited by pain ABD/ER>flex/ext/IR. Patient reports she has been having some pain with isometric abd and ER so PT advised her to cease this exercise until further notice d/t increased inflammation in the shoulder. Pt was advised to continue icing 3-5x/day for inflammation and continue gentle stretching as able and isometrics that are not painful. Patient verbalized understanding of this.     Clinical Impairments Affecting Rehab Potential  (-) chronicity of sx, mulitple pain areas in L shoulder (+) motivation, active lifestyle, lack of other comorbidities    PT Frequency  2x / week    PT Duration  8 weeks    PT Treatment/Interventions  Electrical Stimulation;Ultrasound;Cryotherapy;Moist Heat;Iontophoresis 4mg /ml Dexamethasone;Neuromuscular re-education;Passive range of motion;Manual techniques;Dry needling;Functional mobility training;Therapeutic activities;Patient/family  education;Taping;Therapeutic exercise    PT Next Visit Plan   iontophoresis per MD for inflammation, AAROM L shoulder, begin isometric strengthening as able    PT Home Exercise Plan  Isometrics 4/5; cane assisted ER/IR/ABD/flex and pendulum exercises  Consulted and Agree with Plan of Care  Patient       Patient will benefit from skilled therapeutic intervention in order to improve the following deficits and impairments:  Increased fascial restricitons, Impaired sensation, Improper body mechanics, Pain, Postural dysfunction, Decreased mobility, Decreased endurance, Decreased range of motion, Decreased strength, Impaired UE functional use  Visit Diagnosis: Stiffness of left shoulder, not elsewhere classified     Problem List Patient Active Problem List   Diagnosis Date Noted  . Osteopenia 06/20/2017   Staci Acosta PT, DPT Staci Acosta 04/03/2018, 7:40 PM  [place] [facility] PH[facility] AND SPORTS MEDICINE [address], [place], [address] Phone: [phone]   Fax:  [phone]  Name: Susan Chaney MRN: [id number] Date of Birth: Oct 13, 1956

## 2018-04-05 ENCOUNTER — Encounter: Payer: Self-pay | Admitting: Physical Therapy

## 2018-04-05 ENCOUNTER — Ambulatory Visit: Payer: 59 | Admitting: Physical Therapy

## 2018-04-05 DIAGNOSIS — M25612 Stiffness of left shoulder, not elsewhere classified: Secondary | ICD-10-CM | POA: Diagnosis not present

## 2018-04-05 NOTE — Therapy (Signed)
[place] [facility] [address], [place], [address] Phone: [phone]   Fax:  [phone]  Physical Therapy Treatment  Patient Details  Name: Susan Chaney MRN: [id number] Date of Birth: 1956/09/11 Referring Provider: Jones Broom, MD   Encounter Date: 04/05/2018  PT End of Session - 04/05/18 1038    Visit Number  5    Number of Visits  17    Date for PT Re-Evaluation  05/03/18    PT Start Time  1030    PT Stop Time  1120    PT Time Calculation (min)  50 min    Activity Tolerance  Patient tolerated treatment well    Behavior During Therapy  [facility] for tasks assessed/performed       Past Medical History:  Diagnosis Date  . Acid reflux   . Anxiety   . IBS (irritable bowel syndrome)   . Insomnia   . Osteopenia   . Osteoporosis   . Solitary cyst of breast     Past Surgical History:  Procedure Laterality Date  . BREAST BIOPSY Left 2001   Negative. 2 areas.   . CHOLECYSTECTOMY  03/2009  . COLONOSCOPY  2009   Dr Elliot/ hemorrhoids  . COLPOSCOPY  09/13/2002  . TUBAL LIGATION  10/04/1983    There were no vitals filed for this visit.  Subjective Assessment - 04/05/18 1032    Subjective  Patient reports she is still having min (1/10) pain with abduction and putting her arm behind her (especially with IR). She reports she has been icing and using ibprofen for inflammation and feels as though her pain is getting better. Pt reports compliance with her HEP    Pertinent History  Patient is a pleasant year old female s/p R manipulation with capsular release 3/26 with supraspinatous repair, and subsequent finding of biceps tendinopathy. Patient reports she also having a nerve block 3/26 which she reports she is still having some numbess in the shoulder and pins and needles in the fingers. Patient reports surgeon instructed her to wear brace 3 days, other than to complete exercises (AAROM flexion/abd). Patient  reports she returns to see surgeon Wed. Patient notes no pain since sugery    Limitations  House hold activities;Reading;Walking;Lifting    How long can you sit comfortably?  unlimited    How long can you stand comfortably?  unlimited    How long can you walk comfortably?  unlimited    Diagnostic tests  MRI and ultrasound evidence of 90% partial thickness supraspinatus RCT and biceps tendinopathy     Patient Stated Goals  Return to bootcamp gym classes, running    Pain Onset  In the past 7 days       Ther-Ex -Pulleys 15x flexion 15x abduction with 5 sec holds -UBE ranger 10x flexion 10x abduction with 3sec holds with mod discomfort at end range abd -Towel IR 15x and ER 15x with 5 sec holds -Sleeper stretch 3x 30sec holds (added to HEP) -Active flexion 1x 10 (not painful to patient)  Manual -STM with trigger point release to teres minor and infraspinatus with noted release  Increased time spent in this area to decrease tension -Mulitple PROM into flex/abd/IR/ER with 8-10sec holds  Ice pack at end of session (unbilled) 10 mins                        PT Education - 04/05/18 1037    Education  provided  Yes    Education Details  Exercise form    Person(s) Educated  Patient    Methods  Explanation;Demonstration;Tactile cues;Verbal cues    Comprehension  Verbal cues required;Returned demonstration;Verbalized understanding;Tactile cues required       PT Short Term Goals - 03/22/18 1608      PT SHORT TERM GOAL #1   Title   Pt will be independent with HEP in order to improve strength and ROM to improve function at home and work.    Time  6    Period  Weeks    Status  New        PT Long Term Goals - 03/22/18 1609      PT LONG TERM GOAL #1   Title   Patient will increase FOTO score to 70 to demonstrate predicted increase in functional mobility to complete ADLs    Baseline  3/28 42    Time  6    Period  Weeks    Status  New      PT LONG TERM GOAL #2    Title  Pt will decrease quick DASH score by at least 8% in order to demonstrate clinically significant reduction in disability.    Baseline  3/27 77.3%    Time  6    Period  Weeks    Status  New      PT LONG TERM GOAL #3   Title  Patient will demonstrate symmetrical L shoulder ROM wnl in order to complete ADLs    Baseline  3/28 minimal AROM (unable), PROM see eval note    Time  6    Period  Weeks    Status  New      PT LONG TERM GOAL #4   Title  Patient will demonstrate gross L shoulder strength symmetrical to R side in order to safely participate in bootcamp exercise classes    Baseline  3/28 unable to test L; see R in eval note    Time  6    Period  Weeks    Status  New              Patient will benefit from skilled therapeutic intervention in order to improve the following deficits and impairments:     Visit Diagnosis: No diagnosis found.     Problem List Patient Active Problem List   Diagnosis Date Noted  . Osteopenia 06/20/2017   Staci Acostahelsea Miller PT, DPT Staci Acostahelsea Miller 04/05/2018, 10:40 AM   [facility] [address] , [place], [address] Phone: [phone]   Fax:  [phone]  Name: Shireen QuanVirginia M Snavely MRN: [id number] Date of Birth: [birth year]

## 2018-04-09 ENCOUNTER — Encounter: Payer: Self-pay | Admitting: Physical Therapy

## 2018-04-09 ENCOUNTER — Ambulatory Visit: Payer: 59 | Admitting: Physical Therapy

## 2018-04-09 DIAGNOSIS — M25612 Stiffness of left shoulder, not elsewhere classified: Secondary | ICD-10-CM | POA: Diagnosis not present

## 2018-04-09 NOTE — Therapy (Signed)
[place] [facility] [address] [place], [address] Phone: [phone]   Fax:  [phone]  Physical Therapy Treatment  Patient Details  Name: Susan Chaney MRN: [id number] Date of Birth: 1956/01/12 Referring Provider: Jones Broom, MD   Encounter Date: 04/09/2018  PT End of Session - 04/11/18 1035    Visit Number  7    Number of Visits  17    Date for PT Re-Evaluation  05/03/18    PT Start Time  0945    PT Stop Time  1035    PT Time Calculation (min)  50 min    Activity Tolerance  Patient tolerated treatment well    Behavior During Therapy  [facility] for tasks assessed/performed       Past Medical History:  Diagnosis Date  . Acid reflux   . Anxiety   . IBS (irritable bowel syndrome)   . Insomnia   . Osteopenia   . Osteoporosis   . Solitary cyst of breast     Past Surgical History:  Procedure Laterality Date  . BREAST BIOPSY Left 2001   Negative. 2 areas.   . CHOLECYSTECTOMY  03/2009  . COLONOSCOPY  2009   Dr Elliot/ hemorrhoids  . COLPOSCOPY  09/13/2002  . TUBAL LIGATION  10/04/1983   There were no vitals filed for this visit.  Subjective Assessment - 04/11/18 0949    Subjective  Patient reports she had some soreness following last session. Patient reports she "thinks" into treatrment helped with some of her inflammation. Patient reports she did a "couple mins" of running (ie speed walking and running across the intersections) and iced the shoulder following which kept her pain down. Patient reports 1/10 pain.     Pertinent History  Patient is a pleasant year old female s/p R manipulation with capsular release 3/26 with supraspinatous repair, and subsequent finding of biceps tendinopathy. Patient reports she also having a nerve block 3/26 which she reports she is still having some numbess in the shoulder and pins and needles in the fingers. Patient reports surgeon instructed her to wear brace 3 days,  other than to complete exercises (AAROM flexion/abd). Patient reports she returns to see surgeon Wed. Patient notes no pain since sugery    Limitations  House hold activities;Reading;Walking;Lifting    How long can you sit comfortably?  unlimited    How long can you stand comfortably?  unlimited    How long can you walk comfortably?  unlimited    Diagnostic tests  MRI and ultrasound evidence of 90% partial thickness supraspinatus RCT and biceps tendinopathy     Patient Stated Goals  Return to [employer] classes, running         Ther-Ex -Pulleys for AAROM 20x flexion and 20x abduction w/ 3-5sec holds -UE ranger flex/abd 15x each with 3-5sec holds -Isometric flex/abd/IR/ER/ext 2x 10each position with min cuing for true isometric without movement    Manual - GHJ AP mobs G III 30sec bouts 6 bouts in neutral, 6 bouts in end range abd, and 6 bouts in end range ER - PROM into ER/IR 20x each direction with 3-5 sec holds increasing ROM each bout - STM w/ trigger point release to R tricep, UT and supraspinatus with 75% tension relief following.     Iontophoresis  With pt in sitting at 40mA- min (Education provided on physiology involved, patch wear time, possible side affects) w/ medium ionto Patch of Dexamethasone 2 mL, , 4-6  hr wear time. Patient verbally consents to treatment.               PT Education - 04/11/18 1011    Education provided  Yes    Education Details  exercise form; HEP progression; inflammation on signs/sx of blood clotting    Person(s) Educated  Patient    Methods  Explanation;Demonstration;Tactile cues;Verbal cues    Comprehension  Verbal cues required;Tactile cues required;Verbalized understanding;Returned demonstration       PT Short Term Goals - 03/22/18 1608      PT SHORT TERM GOAL #1   Title   Pt will be independent with HEP in order to improve strength and ROM to improve function at home and work.    Time  6    Period  Weeks    Status   New        PT Long Term Goals - 03/22/18 1609      PT LONG TERM GOAL #1   Title   Patient will increase FOTO score to 70 to demonstrate predicted increase in functional mobility to complete ADLs    Baseline  3/28 42    Time  6    Period  Weeks    Status  New      PT LONG TERM GOAL #2   Title  Pt will decrease quick DASH score by at least 8% in order to demonstrate clinically significant reduction in disability.    Baseline  3/27 77.3%    Time  6    Period  Weeks    Status  New      PT LONG TERM GOAL #3   Title  Patient will demonstrate symmetrical L shoulder ROM wnl in order to complete ADLs    Baseline  3/28 minimal AROM (unable), PROM see eval note    Time  6    Period  Weeks    Status  New      PT LONG TERM GOAL #4   Title  Patient will demonstrate gross L shoulder strength symmetrical to R side in order to safely participate in bootcamp exercise classes    Baseline  3/28 unable to test L; see R in eval note    Time  6    Period  Weeks    Status  New            Plan - 04/11/18 1113    Clinical Impression Statement  Patient has full AAROM in all directions at this point after a few repititions of AAROM in each direction. Patient is still having some slight discomfort with shoulder isometrics, but was able to complete standing row with no pain after cuing for periscapular contraction vs. excessive UE motion. PT will continue to progress periscapular therex and add shoulder AROM next week as able after weekend activities. Pt was concerned that she thinks she may have a blood clot at her tricep where she has notable trigger point, and PT offered extensive education on blood clot signs/sx vs inflammation and muscular soreness signs/sx (see treatment note).     Rehab Potential  Good    Clinical Impairments Affecting Rehab Potential  (-) chronicity of sx, mulitple pain areas in L shoulder (+) motivation, active lifestyle, lack of other comorbidities    PT Frequency  2x /  week    PT Duration  8 weeks    PT Treatment/Interventions  Electrical Stimulation;Ultrasound;Cryotherapy;Moist Heat;Iontophoresis 4mg /ml Dexamethasone;Neuromuscular re-education;Passive range of motion;Manual techniques;Dry needling;Functional mobility training;Therapeutic activities;Patient/family education;Taping;Therapeutic exercise  PT Next Visit Plan   iontophoresis per MD for inflammation, AROM as able, periscapular strengthening    PT Home Exercise Plan  4/17: standing rows, and tricep stretch; Isometrics 4/5; cane assisted ER/IR/ABD/flex and pendulum exercises    Consulted and Agree with Plan of Care  Patient       Patient will benefit from skilled therapeutic intervention in order to improve the following deficits and impairments:  Increased fascial restricitons, Impaired sensation, Improper body mechanics, Pain, Postural dysfunction, Decreased mobility, Decreased endurance, Decreased range of motion, Decreased strength, Impaired UE functional use  Visit Diagnosis: Stiffness of left shoulder, not elsewhere classified     Problem List Patient Active Problem List   Diagnosis Date Noted  . Osteopenia 06/20/2017   Staci Acosta PT, DPT Staci Acosta 04/11/2018, 11:35 AM  [place] [facility] PH[facility] AND SPORTS MEDICINE [address], [place], [address] Phone: [phone]   Fax:  [phone]  Name: Susan Chaney MRN: [id number] Date of Birth: 05-14-1956

## 2018-04-11 ENCOUNTER — Ambulatory Visit: Payer: 59 | Admitting: Physical Therapy

## 2018-04-11 ENCOUNTER — Encounter: Payer: Self-pay | Admitting: Physical Therapy

## 2018-04-11 DIAGNOSIS — M25612 Stiffness of left shoulder, not elsewhere classified: Secondary | ICD-10-CM

## 2018-04-11 NOTE — Therapy (Signed)
[facility] PH[facility] MEDICINE [address] [place], [place], [address] Phone: [phone]   Fax:  [phone]  Physical Therapy Treatment  Patient Details  Name: Susan Chaney MRN: [id number] Date of Birth: 12-15-1956 Referring Provider: Jones BroomJustin Chandler, MD   Encounter Date: 04/11/2018  PT End of Session - 04/11/18 1035    Visit Number  7    Number of Visits  17    Date for PT Re-Evaluation  05/03/18    PT Start Time  0945    PT Stop Time  1035    PT Time Calculation (min)  50 min    Activity Tolerance  Patient tolerated treatment well    Behavior During Therapy  [facility]       Past Medical History:  Diagnosis Date  . Acid reflux   . Anxiety   . IBS (irritable bowel syndrome)   . Insomnia   . Osteopenia   . Osteoporosis   . Solitary cyst of breast     Past Surgical History:  Procedure Laterality Date  . BREAST BIOPSY Left 2001   Negative. 2 areas.   . CHOLECYSTECTOMY  03/2009  . COLONOSCOPY  2009   Dr Elliot/ hemorrhoids  . COLPOSCOPY  09/13/2002  . TUBAL LIGATION  10/04/1983    There were no vitals filed for this visit.  Subjective Assessment - 04/11/18 0949    Subjective  Patient reports she had some soreness following last session. Patient reports she "thinks" into treatrment helped with some of her inflammation. Patient reports she did a "couple mins" of running (ie speed walking and running across the intersections) and iced the shoulder following which kept her pain down. Patient reports 1/10 pain.     Pertinent History  Patient is a pleasant year old female s/p R manipulation with capsular release 3/26 with supraspinatous repair, and subsequent finding of biceps tendinopathy. Patient reports she also having a nerve block 3/26 which she reports she is still having some numbess in the shoulder and pins and needles in the fingers. Patient reports surgeon instructed her to wear brace 3  days, other than to complete exercises (AAROM flexion/abd). Patient reports she returns to see surgeon Wed. Patient notes no pain since sugery    Limitations  House hold activities;Reading;Walking;Lifting    How long can you sit comfortably?  unlimited    How long can you stand comfortably?  unlimited    How long can you walk comfortably?  unlimited    Diagnostic tests  MRI and ultrasound evidence of 90% partial thickness supraspinatus RCT and biceps tendinopathy     Patient Stated Goals  Return to [employer] classes, running      Ther-Ex -Pulleys 10x into flex and 10x abd AAROM  -UE ranger 3x 10 clockwise and counter clockwise -Standing rows 3x 20 w/ yellow tband w/ cuing for eccentric control and for proper periscapular contraction as lopposed to excessive UE use; and education to add exercise to HEP to begin pain free strengthening of surrounding musculature. Pt educated to advance to red tband IF exercise causes no excess pain/soreness and is no longer challenging with yellow tband -Isometric abd/ER/IR 1x 15 each - Patient was concerned about visible/palpable knot at mid tricep muscle belly being a "blood clot". PT assessed musculature, which patient reports soreness with resisted extension. During ionto treatment PT resolved trigger point 50% with trigger point release after 5 mins and PT educated patient that she has no  other risk factors for blood clots and that trigger point is muscular in nature. Patients R shoulder is slightly warm, but not significantly more than L, so PT attributes this to patient wearing a sweater today. There is some mild redness at R shoulder, but it is known that patient has increased inflammation per surgeon appt last week. Pt was advised to continue tricep stretch, which PT demo'd for her and to massage muscle as able, and to alert PT should she have any changes in pain (area becomes very tender)/ more color or temp changes.   Manual - trigger point release to R  tricep muscle belly    Iontophoresis  With pt in sitting at 40mA- min ( with blood clot vs. inflammation education, reinforced wear time of patch and possible redness following, and set up; other "manual" and "therex" education/action provided during ionto session as well billed in appropriate sections) w/ medium ionto Patch of Dexamethasone 2 mL, , 4-6 hr wear time                       PT Education - 04/11/18 1011    Education provided  Yes    Education Details  exercise form; HEP progression; inflammation on signs/sx of blood clotting    Person(s) Educated  Patient    Methods  Explanation;Demonstration;Tactile cues;Verbal cues    Comprehension  Verbal cues required;Tactile cues required;Verbalized understanding;Returned demonstration       PT Short Term Goals - 03/22/18 1608      PT SHORT TERM GOAL #1   Title   Pt will be independent with HEP in order to improve strength and ROM to improve function at home and work.    Time  6    Period  Weeks    Status  New        PT Long Term Goals - 03/22/18 1609      PT LONG TERM GOAL #1   Title   Patient will increase FOTO score to 70 to demonstrate predicted increase in functional mobility to complete ADLs    Baseline  3/28 42    Time  6    Period  Weeks    Status  New      PT LONG TERM GOAL #2   Title  Pt will decrease quick DASH score by at least 8% in order to demonstrate clinically significant reduction in disability.    Baseline  3/27 77.3%    Time  6    Period  Weeks    Status  New      PT LONG TERM GOAL #3   Title  Patient will demonstrate symmetrical L shoulder ROM wnl in order to complete ADLs    Baseline  3/28 minimal AROM (unable), PROM see eval note    Time  6    Period  Weeks    Status  New      PT LONG TERM GOAL #4   Title  Patient will demonstrate gross L shoulder strength symmetrical to R side in order to safely participate in bootcamp exercise classes    Baseline  3/28 unable  to test L; see R in eval note    Time  6    Period  Weeks    Status  New            Plan - 04/11/18 1113    Clinical Impression Statement  Patient has full AAROM in all directions at this point after  a few repititions of AAROM in each direction. Patient is still having some slight discomfort with shoulder isometrics, but was able to complete standing row with no pain after cuing for periscapular contraction vs. excessive UE motion. PT will continue to progress periscapular therex and add shoulder AROM next week as able after weekend activities. Pt was concerned that she thinks she may have a blood clot at her tricep where she has notable trigger point, and PT offered extensive education on blood clot signs/sx vs inflammation and muscular soreness signs/sx (see treatment note).     Rehab Potential  Good    Clinical Impairments Affecting Rehab Potential  (-) chronicity of sx, mulitple pain areas in L shoulder (+) motivation, active lifestyle, lack of other comorbidities    PT Frequency  2x / week    PT Duration  8 weeks    PT Treatment/Interventions  Electrical Stimulation;Ultrasound;Cryotherapy;Moist Heat;Iontophoresis 4mg /ml Dexamethasone;Neuromuscular re-education;Passive range of motion;Manual techniques;Dry needling;Functional mobility training;Therapeutic activities;Patient/family education;Taping;Therapeutic exercise    PT Next Visit Plan   iontophoresis per MD for inflammation, AROM as able, periscapular strengthening    PT Home Exercise Plan  4/17: standing rows, and tricep stretch; Isometrics 4/5; cane assisted ER/IR/ABD/flex and pendulum exercises    Consulted and Agree with Plan of Care  Patient       Patient will benefit from skilled therapeutic intervention in order to improve the following deficits and impairments:  Increased fascial restricitons, Impaired sensation, Improper body mechanics, Pain, Postural dysfunction, Decreased mobility, Decreased endurance, Decreased range of  motion, Decreased strength, Impaired UE functional use  Visit Diagnosis: Stiffness of left shoulder, not elsewhere classified     Problem List Patient Active Problem List   Diagnosis Date Noted  . Osteopenia 06/20/2017   Staci Acosta PT, DPT Staci Acosta 04/11/2018, 11:27 AM  [facility] PHYSICAL AND SPORTS MEDICINE [address] [place], [address] Phone: [phone]   Fax:  [phone]  Name: Susan Chaney MRN: [id number] Date of Birth: September 08, 1956

## 2018-04-16 ENCOUNTER — Encounter: Payer: Self-pay | Admitting: Physical Therapy

## 2018-04-16 ENCOUNTER — Ambulatory Visit: Payer: 59 | Admitting: Physical Therapy

## 2018-04-16 DIAGNOSIS — M25612 Stiffness of left shoulder, not elsewhere classified: Secondary | ICD-10-CM

## 2018-04-16 NOTE — Therapy (Signed)
[facility] [address], [place], [address] Phone: [phone]   Fax:  [phone]  Physical Therapy Treatment  Patient Details  Name: Susan Chaney MRN: [id number] Date of Birth: 1956-05-31 Referring Provider: Jones Broom, MD   Encounter Date: 04/16/2018  PT End of Session - 04/16/18 1306    Visit Number  8    Number of Visits  17    Date for PT Re-Evaluation  05/03/18    PT Start Time  0100    PT Stop Time  0145    PT Time Calculation (min)  45 min    Equipment Utilized During Treatment  Other (comment)    Activity Tolerance  Patient tolerated treatment well    Behavior During Therapy  [facility] for tasks assessed/performed       Past Medical History:  Diagnosis Date  . Acid reflux   . Anxiety   . IBS (irritable bowel syndrome)   . Insomnia   . Osteopenia   . Osteoporosis   . Solitary cyst of breast     Past Surgical History:  Procedure Laterality Date  . BREAST BIOPSY Left 2001   Negative. 2 areas.   . CHOLECYSTECTOMY  03/2009  . COLONOSCOPY  2009   Dr Elliot/ hemorrhoids  . COLPOSCOPY  09/13/2002  . TUBAL LIGATION  10/04/1983    There were no vitals filed for this visit.  Subjective Assessment - 04/16/18 1302    Subjective  Patient reports she is having minimal pain in the shoulder at this time, only some mild "tightness" early in the morning. Patient reports she has been deligent with her HEP and "speed walking"  and would like to ease into running this week.     Pertinent History  Patient is a pleasant year old female s/p R manipulation with capsular release 3/26 with supraspinatous repair, and subsequent finding of biceps tendinopathy. Patient reports she also having a nerve block 3/26 which she reports she is still having some numbess in the shoulder and pins and needles in the fingers. Patient reports surgeon instructed her to wear brace 3 days, other than to complete exercises  (AAROM flexion/abd). Patient reports she returns to see surgeon Wed. Patient notes no pain since sugery    Limitations  House hold activities;Reading;Walking;Lifting    How long can you sit comfortably?  unlimited    How long can you stand comfortably?  unlimited    How long can you walk comfortably?  unlimited    Diagnostic tests  MRI and ultrasound evidence of 90% partial thickness supraspinatus RCT and biceps tendinopathy     Patient Stated Goals  Return to [employer]camp gym classes, running    Pain Onset  In the past 7 days      Ther-Ex -Pulleys warmup 2 min flex 2 min abd for warmup  -Red tband rows 3x 12 with min cuing for ROM to sustain tension on muscle -Red tband IR 3x 12 -BW shoulder abd 2x 12 w/ min cuing to prevent  -BW shoulder flex 3x 12 -YTI 2x 12e with min cuing needed for proper scapular movement -SCM stretch 3x 30sec holds. Pt was concerned that her R collarbone is slightly medially protruding. PT educated patient on muscles that attach to this area and the effect of imbalances (tight pec and SCM with weak UT and deltoids). PT educated patient to continue doorway pec stretch and SCM stretch, and that PT is beginning RTC strengthening this week.  Manual -Multiple bouts of PROM into flex/abd/IR/ER with 3-5 sec holds. With ER patient has pain at posterior shoulder reporting this feels "tight". With abd patient reports "catching sensation" at approx 100d that subsides with abd + PA mob, demonstrating shoulder instability                          PT Education - 04/16/18 1304    Education provided  Yes    Education Details  Exercise form    Person(s) Educated  Patient    Methods  Explanation;Verbal cues;Tactile cues    Comprehension  Verbalized understanding;Returned demonstration;Verbal cues required;Tactile cues required       PT Short Term Goals - 03/22/18 1608      PT SHORT TERM GOAL #1   Title   Pt will be independent with HEP in order to improve  strength and ROM to improve function at home and work.    Time  6    Period  Weeks    Status  New        PT Long Term Goals - 03/22/18 1609      PT LONG TERM GOAL #1   Title   Patient will increase FOTO score to 70 to demonstrate predicted increase in functional mobility to complete ADLs    Baseline  3/28 42    Time  6    Period  Weeks    Status  New      PT LONG TERM GOAL #2   Title  Pt will decrease quick DASH score by at least 8% in order to demonstrate clinically significant reduction in disability.    Baseline  3/27 77.3%    Time  6    Period  Weeks    Status  New      PT LONG TERM GOAL #3   Title  Patient will demonstrate symmetrical L shoulder ROM wnl in order to complete ADLs    Baseline  3/28 minimal AROM (unable), PROM see eval note    Time  6    Period  Weeks    Status  New      PT LONG TERM GOAL #4   Title  Patient will demonstrate gross L shoulder strength symmetrical to R side in order to safely participate in bootcamp exercise classes    Baseline  3/28 unable to test L; see R in eval note    Time  6    Period  Weeks    Status  New            Plan - 04/16/18 1415    Clinical Impression Statement  Patient tolerated introduction of therex well with no increased pain, only muscle fatigue. Patient required min cuing for therex to prevent compensation. During manual PROM bouts PT assessed that patient had greater abd motion with stability, AP pressure sustained. PT explained to patient the effect of muscle weakness on shoulder stability; patient verbalized understanding. Pt was concerned that her R collarbone is slightly medially protruding. PT educated patient on muscles that attach to this area and the effect of imbalances (tight pec and SCM with weak UT and deltoids). PT educated patient to continue doorway pec stretch and SCM stretch, and that PT is beginning RTC strengthening this week. PT will continue to progress RTC strengthening as able    PT  Treatment/Interventions  Electrical Stimulation;Ultrasound;Cryotherapy;Moist Heat;Iontophoresis 4mg /ml Dexamethasone;Neuromuscular re-education;Passive range of motion;Manual techniques;Dry needling;Functional mobility training;Therapeutic activities;Patient/family education;Taping;Therapeutic exercise  PT Next Visit Plan   continue RTC and periscapular strengthening as able; iontophoresis per MD for inflammation, A    PT Home Exercise Plan  4/17: standing rows, and tricep stretch; Isometrics 4/5; cane assisted ER/IR/ABD/flex and pendulum exercises    Consulted and Agree with Plan of Care  Patient       Patient will benefit from skilled therapeutic intervention in order to improve the following deficits and impairments:  Increased fascial restricitons, Impaired sensation, Improper body mechanics, Pain, Postural dysfunction, Decreased mobility, Decreased endurance, Decreased range of motion, Decreased strength, Impaired UE functional use  Visit Diagnosis: Stiffness of left shoulder, not elsewhere classified     Problem List Patient Active Problem List   Diagnosis Date Noted  . Osteopenia 06/20/2017   Susan Chaney PT, DPT Susan Chaney 04/16/2018, 2:42 PM  [facility] PHYSICAL AND SPORTS MEDICINE [address] [place], [address] Phone: [phone]   Fax:  [phone]  Name: Susan Chaney MRN: [id number] Date of Birth: 07-03-1956

## 2018-04-18 ENCOUNTER — Encounter: Payer: Self-pay | Admitting: Physical Therapy

## 2018-04-18 ENCOUNTER — Ambulatory Visit: Payer: 59 | Admitting: Physical Therapy

## 2018-04-18 DIAGNOSIS — M25612 Stiffness of left shoulder, not elsewhere classified: Secondary | ICD-10-CM

## 2018-04-18 NOTE — Therapy (Signed)
[facility] [address] [place], [address] Phone: [phone]   Fax:  [phone]  Physical Therapy Treatment  Patient Details  Name: Susan Chaney MRN: [id number] Date of Birth: February 24, 1956 Referring Provider: Jones Broom, MD   Encounter Date: 04/18/2018  PT End of Session - 04/18/18 1536    Visit Number  9    Number of Visits  17    Date for PT Re-Evaluation  05/03/18    PT Start Time  0315    PT Stop Time  0400    PT Time Calculation (min)  45 min    Activity Tolerance  Patient tolerated treatment well    Behavior During Therapy  [facility] for tasks assessed/performed       Past Medical History:  Diagnosis Date  . Acid reflux   . Anxiety   . IBS (irritable bowel syndrome)   . Insomnia   . Osteopenia   . Osteoporosis   . Solitary cyst of breast     Past Surgical History:  Procedure Laterality Date  . BREAST BIOPSY Left 2001   Negative. 2 areas.   . CHOLECYSTECTOMY  03/2009  . COLONOSCOPY  2009   Dr Elliot/ hemorrhoids  . COLPOSCOPY  09/13/2002  . TUBAL LIGATION  10/04/1983    There were no vitals filed for this visit.  Subjective Assessment - 04/18/18 1522    Subjective  Patient reports she went for a walk/run yesterday and worked in her garden. She reports she used ice at the end of the day yesterday which she reports "helped some". Patient reports she has been doing her stretches and they also made her a "little sore".     Pertinent History  Patient is a pleasant year old female s/p R manipulation with capsular release 3/26 with supraspinatous repair, and subsequent finding of biceps tendinopathy. Patient reports she also having a nerve block 3/26 which she reports she is still having some numbess in the shoulder and pins and needles in the fingers. Patient reports surgeon instructed her to wear brace 3 days, other than to complete exercises (AAROM flexion/abd). Patient reports she  returns to see surgeon Wed. Patient notes no pain since sugery    Limitations  House hold activities;Reading;Walking;Lifting    How long can you sit comfortably?  unlimited    How long can you stand comfortably?  unlimited    How long can you walk comfortably?  unlimited    Diagnostic tests  MRI and ultrasound evidence of 90% partial thickness supraspinatus RCT and biceps tendinopathy     Patient Stated Goals  Return to [employer] classes, running          Ther-Ex -UBE warm-up forward 2 min backward -Standing row 1x 12 10# 2x 10 13# w/ cuing for eccentric control and posture -Standing high row 3x 12 w/ red tband and cuing for posture -IR with blue tband 3x12 -ER with blue tband 3x12 -Unilateral farmers carry w/ 9# wt 79ft x2 with min TC for proper shoulder height -Total gym pull up level 20 3x 10 w/ cuing for eccentric control  -Wall slides with yellow tband and form roller for scapular control 3x 10 with VC to maintain 90/90 UE position against tband resistance                    PT Education - 04/18/18 1526    Education provided  Yes    Education Details  Exercise form    Person(s) Educated  Patient    Methods  Explanation;Verbal cues    Comprehension  Verbal cues required;Returned demonstration;Verbalized understanding       PT Short Term Goals - 03/22/18 1608      PT SHORT TERM GOAL #1   Title   Pt will be independent with HEP in order to improve strength and ROM to improve function at home and work.    Time  6    Period  Weeks    Status  New        PT Long Term Goals - 03/22/18 1609      PT LONG TERM GOAL #1   Title   Patient will increase FOTO score to 70 to demonstrate predicted increase in functional mobility to complete ADLs    Baseline  3/28 42    Time  6    Period  Weeks    Status  New      PT LONG TERM GOAL #2   Title  Pt will decrease quick DASH score by at least 8% in order to demonstrate clinically significant reduction in  disability.    Baseline  3/27 77.3%    Time  6    Period  Weeks    Status  New      PT LONG TERM GOAL #3   Title  Patient will demonstrate symmetrical L shoulder ROM wnl in order to complete ADLs    Baseline  3/28 minimal AROM (unable), PROM see eval note    Time  6    Period  Weeks    Status  New      PT LONG TERM GOAL #4   Title  Patient will demonstrate gross L shoulder strength symmetrical to R side in order to safely participate in bootcamp exercise classes    Baseline  3/28 unable to test L; see R in eval note    Time  6    Period  Weeks    Status  New            Plan - 04/18/18 1540    Clinical Impression Statement  Patient tolerated therex progression well with no pain, only increased muscle soreness. Patient required some cuing for proper exercise form that she was able to demonstrate 100% carry over with by her final set. Patient is demonstrating good scapular control and good scapulo-humeral rhythm. PT advised patient to use ice this evening for soreness from therex today.     Clinical Impairments Affecting Rehab Potential  (-) chronicity of sx, mulitple pain areas in L shoulder (+) motivation, active lifestyle, lack of other comorbidities    PT Frequency  2x / week    PT Duration  8 weeks    PT Treatment/Interventions  Electrical Stimulation;Ultrasound;Cryotherapy;Moist Heat;Iontophoresis 4mg /ml Dexamethasone;Neuromuscular re-education;Passive range of motion;Manual techniques;Dry needling;Functional mobility training;Therapeutic activities;Patient/family education;Taping;Therapeutic exercise    PT Next Visit Plan   continue RTC and periscapular strengthening as able; iontophoresis per MD for inflammation, A    PT Home Exercise Plan  4/17: standing rows, and tricep stretch; Isometrics 4/5; cane assisted ER/IR/ABD/flex and pendulum exercises    Consulted and Agree with Plan of Care  Patient       Patient will benefit from skilled therapeutic intervention in order to  improve the following deficits and impairments:  Increased fascial restricitons, Impaired sensation, Improper body mechanics, Pain, Postural dysfunction, Decreased mobility, Decreased endurance, Decreased range of motion, Decreased strength, Impaired UE functional use  Visit  Diagnosis: Stiffness of left shoulder, not elsewhere classified     Problem List Patient Active Problem List   Diagnosis Date Noted  . Osteopenia 06/20/2017   Staci Acostahelsea Miller PT, DPT Staci Acostahelsea Miller 04/18/2018, 4:24 PM  [facility] [address] [place], [place], [address] Phone: [phone]   Fax:  [phone]  Name: Susan Chaney MRN: [id number] Date of Birth: [birth year]

## 2018-04-23 ENCOUNTER — Encounter: Payer: Self-pay | Admitting: Physical Therapy

## 2018-04-23 ENCOUNTER — Ambulatory Visit: Payer: 59 | Admitting: Physical Therapy

## 2018-04-23 DIAGNOSIS — M25612 Stiffness of left shoulder, not elsewhere classified: Secondary | ICD-10-CM | POA: Diagnosis not present

## 2018-04-23 NOTE — Therapy (Signed)
[facility] [facility] PHYS[facility] AND SPORTS MEDICINE [address] [place], [address] Phone: [phone]   Fax:  [phone]  Physical Therapy Treatment  Patient Details  Name: Susan Chaney MRN: [id number] Date of Birth: 1956-02-08 Referring Provider: Jones Broom, MD   Encounter Date: 04/23/2018  PT End of Session - 04/23/18 1004    Visit Number  10    Number of Visits  17    Date for PT Re-Evaluation  05/03/18    PT Start Time  0945    PT Stop Time  0930    PT Time Calculation (min)  1425 min    Equipment Utilized During Treatment  Other (comment)    Activity Tolerance  Patient tolerated treatment well    Behavior During Therapy  [facility] for tasks assessed/performed       Past Medical History:  Diagnosis Date  . Acid reflux   . Anxiety   . IBS (irritable bowel syndrome)   . Insomnia   . Osteopenia   . Osteoporosis   . Solitary cyst of breast     Past Surgical History:  Procedure Laterality Date  . BREAST BIOPSY Left 2001   Negative. 2 areas.   . CHOLECYSTECTOMY  03/2009  . COLONOSCOPY  2009   Dr Elliot/ hemorrhoids  . COLPOSCOPY  09/13/2002  . TUBAL LIGATION  10/04/1983    There were no vitals filed for this visit.  Subjective Assessment - 04/23/18 0951    Subjective  Patient reports she was sore after last session, but reported she did go for a walk/run. She reports she used ice and the soreness subsided by Friday night. Patient reports she ran in intervals between walking and reports this went wel.    Pertinent History  Patient is a pleasant year old female s/p R manipulation with capsular release 3/26 with supraspinatous repair, and subsequent finding of biceps tendinopathy. Patient reports she also having a nerve block 3/26 which she reports she is still having some numbess in the shoulder and pins and needles in the fingers. Patient reports surgeon instructed her to wear brace 3 days, other than to complete exercises  (AAROM flexion/abd). Patient reports she returns to see surgeon Wed. Patient notes no pain since sugery    Limitations  House hold activities;Reading;Walking;Lifting    How long can you sit comfortably?  unlimited    How long can you stand comfortably?  unlimited    How long can you walk comfortably?  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 with min cuing for proper ROM  -TRX pull w/ pronated grip 3x 10 with cuing for eccentric control and time-under-tension with education on prevention of shoulder dislocation                     PT Education - 04/23/18 0956    Education provided  Yes    Education Details  Exercise form    Person(s) Educated  Patient    Methods  Explanation;Demonstration;Verbal cues    Comprehension  Returned demonstration;Verbalized understanding       PT Short Term Goals - 03/22/18 1608      PT SHORT TERM GOAL #1   Title   Pt will be independent with HEP in order to improve strength and ROM to improve function at home and work.    Time  6    Period  Weeks    Status  New        PT Long Term Goals - 03/22/18 1609      PT LONG TERM GOAL #1   Title   Patient will increase FOTO score to 70 to demonstrate predicted increase in functional mobility to complete ADLs    Baseline  3/28 42    Time  6    Period  Weeks    Status  New      PT LONG TERM GOAL #2   Title  Pt will decrease quick DASH score by at least 8% in order to  demonstrate clinically significant reduction in disability.    Baseline  3/27 77.3%    Time  6    Period  Weeks    Status  New      PT LONG TERM GOAL #3   Title  Patient will demonstrate symmetrical L shoulder ROM wnl in order to complete ADLs    Baseline  3/28 minimal AROM (unable), PROM see eval note    Time  6    Period  Weeks    Status  New      PT LONG TERM GOAL #4   Title  Patient will demonstrate gross L shoulder strength symmetrical to R side in order to safely participate in bootcamp exercise classes    Baseline  3/28 unable to test L; see R in eval note    Time  6    Period  Weeks    Status  New            Plan - 04/23/18 1008    Clinical Impression Statement  Patient tolerated therex progression well, with most "pain" sensation being described as a stretch, localized to the antagonsit of the contracting muscle. Patient expressed concern of returning to bootcamp course at the gym this June and wants to ensure she is able to safely complete strenghtening exercises before returning to this. PT encouraged patient to continue standing row exercise increasing to blue tband and add shoulder IR/ER w/ blue band to HEP, with stretching for shoulder/periscapular musculature. PT will assess patient running next treatment (as pt will be able to bring running clothes).     Clinical Impairments Affecting Rehab Potential  (-) chronicity of sx, mulitple pain areas in L shoulder (+) motivation, active lifestyle, lack of other comorbidities    PT Frequency  2x / week    PT Duration  8 weeks    PT Treatment/Interventions  Electrical Stimulation;Ultrasound;Cryotherapy;Moist Heat;Iontophoresis /ml Dexamethasone;Neuromuscular re-education;Passive range of motion;Manual techniques;Dry needling;Functional mobility training;Therapeutic activities;Patient/family education;Taping;Therapeutic exercise    PT Next Visit Plan  treadmill running assessment;  continue RTC and periscapular strengthening  as able;    PT Home Exercise Plan  standing rows, shoulder IR/ER, and tricep stretch; sleeper stretch, SCM stretch    Consulted and Agree with Plan of Care  Patient       Patient will benefit from skilled therapeutic intervention in order  to improve the following deficits and impairments:  Increased fascial restricitons, Impaired sensation, Improper body mechanics, Pain, Postural dysfunction, Decreased mobility, Decreased endurance, Decreased range of motion, Decreased strength, Impaired UE functional use  Visit Diagnosis: Stiffness of left shoulder, not elsewhere classified     Problem List Patient Active Problem List   Diagnosis Date Noted  . Osteopenia 06/20/2017   Staci Acosta PT, DPT Staci Acosta 04/23/2018, 10:31 AM  [place] [facility] PHYSICAL AND SPORTS MEDICINE [address], [place], [address] Phone: [phone]   Fax:  [phone]  Name: Susan Chaney MRN: [id number] Date of Birth: Sep 20, 1956

## 2018-04-25 ENCOUNTER — Ambulatory Visit: Payer: 59 | Attending: Orthopedic Surgery | Admitting: Physical Therapy

## 2018-04-25 ENCOUNTER — Encounter: Payer: Self-pay | Admitting: Physical Therapy

## 2018-04-25 DIAGNOSIS — M25612 Stiffness of left shoulder, not elsewhere classified: Secondary | ICD-10-CM | POA: Insufficient documentation

## 2018-04-25 DIAGNOSIS — M25511 Pain in right shoulder: Secondary | ICD-10-CM | POA: Diagnosis present

## 2018-04-25 NOTE — Therapy (Signed)
[place] [facility] PHYS[facility]ORTS MEDICINE [address] [place], [address] Phone: [phone]   Fax:  [phone]  Physical Therapy Treatment  Patient Details  Name: Susan Chaney MRN: [id number] Date of Birth: Feb 20, 1956 Referring Provider: Jones Broom, MD   Encounter Date: 04/25/2018  PT End of Session - 04/25/18 1333    Visit Number  11    Number of Visits  17    Date for PT Re-Evaluation  05/03/18    PT Start Time  0100    PT Stop Time  0145    PT Time Calculation (min)  45 min    Activity Tolerance  Patient tolerated treatment well    Behavior During Therapy  [facility] for tasks assessed/performed       Past Medical History:  Diagnosis Date  . Acid reflux   . Anxiety   . IBS (irritable bowel syndrome)   . Insomnia   . Osteopenia   . Osteoporosis   . Solitary cyst of breast     Past Surgical History:  Procedure Laterality Date  . BREAST BIOPSY Left 2001   Negative. 2 areas.   . CHOLECYSTECTOMY  03/2009  . COLONOSCOPY  2009   Dr Elliot/ hemorrhoids  . COLPOSCOPY  09/13/2002  . TUBAL LIGATION  10/04/1983    There were no vitals filed for this visit.  Subjective Assessment - 04/25/18 1318    Subjective  Patient reports she was having some "achiness" following last session, but it subsided quickly with ice. Patient went for a run this morning and was able to run at a time and walk 3-47min and run again at a "jog pace". Patient reports no pain from this.     Pertinent History  Patient is a pleasant year old female s/p R manipulation with capsular release 3/26 with supraspinatous repair, and subsequent finding of biceps tendinopathy. Patient reports she also having a nerve block 3/26 which she reports she is still having some numbess in the shoulder and pins and needles in the fingers. Patient reports surgeon instructed her to wear brace 3 days, other than to complete exercises (AAROM flexion/abd). Patient reports she  returns to see surgeon Wed. Patient notes no pain since sugery    Limitations  House hold activities;Reading;Walking;Lifting    How long can you sit comfortably?  unlimited    How long can you stand comfortably?  unlimited    How long can you walk comfortably?  unlimited    Diagnostic tests  MRI and ultrasound evidence of 90% partial thickness supraspinatus RCT and biceps tendinopathy     Patient Stated Goals  Return to bootcamp gym classes, running    Pain Onset  In the past 7 days           Ther-Ex -UBE for warmup during hx intake (unbilled) -Total gym pull ups level 20 x12 level 21 x12 level 22 x10 with min cuing for eccentric control  -Cable pec pull across 3x 10 5# with cuing for proper form and proper ROM -Supine pec flys 5# 2x 10 with min cuing for form -Prone horizontal abd 1# 3x 10 w. Cuing for eccentric control  -Serratus punch 2x 12 3# w/ cuing for proper form (Pt unable to properly perform push up plus from knees with proper form)  Gait Training: running analysis at speed walk 3. ; jog 4.1mph; run 6.3. PT analyzed video in slow motion and noted good rotation and no shoulder hiking on either side,  with excess lowering on R d/t weakness of rotator cuff. Patient ran at each speed for 3 min with no noted pain, only fatigue. PT shared findings with pt, and showed her  video for her understanding.                   PT Education - 04/25/18 1332    Education provided  Yes    Education Details  Exercise form    Person(s) Educated  Patient    Methods  Explanation;Demonstration;Verbal cues    Comprehension  Verbal cues required;Verbalized understanding;Returned demonstration       PT Short Term Goals - 03/22/18 1608      PT SHORT TERM GOAL #1   Title   Pt will be independent with HEP in order to improve strength and ROM to improve function at home and work.    Time  6    Period  Weeks    Status  New        PT Long Term Goals - 03/22/18 1609      PT  LONG TERM GOAL #1   Title   Patient will increase FOTO score to 70 to demonstrate predicted increase in functional mobility to complete ADLs    Baseline  3/28 42    Time  6    Period  Weeks    Status  New      PT LONG TERM GOAL #2   Title  Pt will decrease quick DASH score by at least 8% in order to demonstrate clinically significant reduction in disability.    Baseline  3/27 77.3%    Time  6    Period  Weeks    Status  New      PT LONG TERM GOAL #3   Title  Patient will demonstrate symmetrical L shoulder ROM wnl in order to complete ADLs    Baseline  3/28 minimal AROM (unable), PROM see eval note    Time  6    Period  Weeks    Status  New      PT LONG TERM GOAL #4   Title  Patient will demonstrate gross L shoulder strength symmetrical to R side in order to safely participate in bootcamp exercise classes    Baseline  3/28 unable to test L; see R in eval note    Time  6    Period  Weeks    Status  New            Plan - 04/25/18 1438    Clinical Impression Statement  PT completed gait analysis with pt, which revealed weakness in supraspinatus and UT with noted decreased shoulder height on R shoulder. Patient expressed concern returning to bootcamp session this summer as she wants to be able to perform burpees, push-ups, and supine dumbbell pec flys. PT assessed some of these movements today and will continue to work on strengthening and ensuring safety in graded exercise approach for return to bootcamp classes. Patient also has bone deformity on L UE, and reports she has to modify some exercises d/t this.    Clinical Impairments Affecting Rehab Potential  (-) chronicity of sx, mulitple pain areas in L shoulder (+) motivation, active lifestyle, lack of other comorbidities    PT Frequency  2x / week    PT Duration  8 weeks    PT Treatment/Interventions  Electrical Stimulation;Ultrasound;Cryotherapy;Moist Heat;Iontophoresis /ml Dexamethasone;Neuromuscular re-education;Passive  range of motion;Manual techniques;Dry needling;Functional mobility training;Therapeutic activities;Patient/family education;Taping;Therapeutic exercise  PT Next Visit Plan  treadmill running assessment;  continue RTC and periscapular strengthening as able;    PT Home Exercise Plan  standing rows, shoulder IR/ER, and tricep stretch; sleeper stretch, SCM stretch    Consulted and Agree with Plan of Care  Patient       Patient will benefit from skilled therapeutic intervention in order to improve the following deficits and impairments:  Increased fascial restricitons, Impaired sensation, Improper body mechanics, Pain, Postural dysfunction, Decreased mobility, Decreased endurance, Decreased range of motion, Decreased strength, Impaired UE functional use  Visit Diagnosis: Stiffness of left shoulder, not elsewhere classified     Problem List Patient Active Problem List   Diagnosis Date Noted  . Osteopenia 06/20/2017   Staci Acosta PT, DPT Staci Acosta 04/25/2018, 2:47 PM  [place] [facility] PHYSICAL AND SPORTS MEDICINE [address] [place], [address] Phone: [phone]   Fax:  [phone]  Name: Susan Chaney MRN: [id number] Date of Birth: 1956/03/02

## 2018-04-26 ENCOUNTER — Ambulatory Visit: Payer: 59 | Admitting: Physical Therapy

## 2018-05-01 ENCOUNTER — Encounter: Payer: Self-pay | Admitting: Physical Therapy

## 2018-05-01 ENCOUNTER — Ambulatory Visit: Payer: 59 | Admitting: Physical Therapy

## 2018-05-01 DIAGNOSIS — M25612 Stiffness of left shoulder, not elsewhere classified: Secondary | ICD-10-CM

## 2018-05-01 NOTE — Therapy (Signed)
[place] [facility] PH[facility]ORTS MEDICINE [address], [place], [address] Phone: [phone]   Fax:  [phone]  Physical Therapy Treatment  Patient Details  Name: Susan Chaney MRN: [id number] Date of Birth: 03/14/1956 Referring Provider: Jones Broom, MD   Encounter Date: 05/01/2018  PT End of Session - 05/01/18 1658    Visit Number  12    Number of Visits  17    Date for PT Re-Evaluation  05/03/18    PT Start Time  0415    PT Stop Time  0500    PT Time Calculation (min)  45 min    Activity Tolerance  Patient tolerated treatment well    Behavior During Therapy  [facility] for tasks assessed/performed       Past Medical History:  Diagnosis Date  . Acid reflux   . Anxiety   . IBS (irritable bowel syndrome)   . Insomnia   . Osteopenia   . Osteoporosis   . Solitary cyst of breast     Past Surgical History:  Procedure Laterality Date  . BREAST BIOPSY Left 2001   Negative. 2 areas.   . CHOLECYSTECTOMY  03/2009  . COLONOSCOPY  2009   Dr Elliot/ hemorrhoids  . COLPOSCOPY  09/13/2002  . TUBAL LIGATION  10/04/1983    There were no vitals filed for this visit.  Subjective Assessment - 05/01/18 1618    Subjective  Patient reports 4/10 pain today and reports more soreness in the shoulder following a weekend of playing with grandkids and continuing running. Patient requests ionto today as she feels her ant shoulder (points to bicep and pec insertion) is inflammed    Pertinent History  Patient is a pleasant year old female s/p R manipulation with capsular release 3/26 with supraspinatous repair, and subsequent finding of biceps tendinopathy. Patient reports she also having a nerve block 3/26 which she reports she is still having some numbess in the shoulder and pins and needles in the fingers. Patient reports surgeon instructed her to wear brace 3 days, other than to complete exercises (AAROM flexion/abd). Patient reports she returns  to see surgeon Wed. Patient notes no pain since sugery    Limitations  House hold activities;Reading;Walking;Lifting    How long can you sit comfortably?  unlimited    How long can you stand comfortably?  unlimited    How long can you walk comfortably?  unlimited    Diagnostic tests  MRI and ultrasound evidence of 90% partial thickness supraspinatus RCT and biceps tendinopathy     Patient Stated Goals  Return to bootcamp gym classes, running    Pain Onset  In the past 7 days              Ther-Ex -UBE forward 2 min backward for warm up -Standing shoulder ER with red tband 3x 12  -Standing shoulder IR with red tband 3x 12 and cuing for eccentric control -Standing rows 1x 10 2x 12 15# with min cuing for neutral  -Lat pulldowns 3x 10 15#  -YTI- patient unable to complete Y and T today d/t ant shoulder pain more than 5 reps so discontinued; I 3x 10 with 1# wt and min cuing for proper scapular alignment -Education on maintaining ice regimen for soreness and decreasing activity until this subsides; RICE protocl      Iontophoresis  With pt in sitting at 40mA- min (Education provided on physiology involved, patch wear time, possible side affects) w/  medium ionto Patch of Dexamethasone 2 mL, , 4-6 hr wear time. Patient verbally consents to treatment.                  PT Education - 05/01/18 1657    Education provided  Yes    Education Details  RICE protocol; strenghening posterior RTC musculature to restore proper length-tension relationship    Person(s) Educated  Patient    Methods  Explanation;Demonstration;Verbal cues    Comprehension  Verbal cues required;Verbalized understanding       PT Short Term Goals - 03/22/18 1608      PT SHORT TERM GOAL #1   Title   Pt will be independent with HEP in order to improve strength and ROM to improve function at home and work.    Time  6    Period  Weeks    Status  New        PT Long Term Goals - 03/22/18  1609      PT LONG TERM GOAL #1   Title   Patient will increase FOTO score to 70 to demonstrate predicted increase in functional mobility to complete ADLs    Baseline  3/28 42    Time  6    Period  Weeks    Status  New      PT LONG TERM GOAL #2   Title  Pt will decrease quick DASH score by at least 8% in order to demonstrate clinically significant reduction in disability.    Baseline  3/27 77.3%    Time  6    Period  Weeks    Status  New      PT LONG TERM GOAL #3   Title  Patient will demonstrate symmetrical L shoulder ROM wnl in order to complete ADLs    Baseline  3/28 minimal AROM (unable), PROM see eval note    Time  6    Period  Weeks    Status  New      PT LONG TERM GOAL #4   Title  Patient will demonstrate gross L shoulder strength symmetrical to R side in order to safely participate in bootcamp exercise classes    Baseline  3/28 unable to test L; see R in eval note    Time  6    Period  Weeks    Status  New            Plan - 05/01/18 1701    Clinical Impression Statement  Patient presents with increased soreness today following an active weekend with grandchildren. Pt believes (and PT agrees) that she has increased pain d/t irratic movements (having to grab children quickly from falling, etc) where she did not have time to be thoughtful in her movements. With posterior Y and  T exercise patient reports intense "pulling" which she localizes to prox bicep tendon insertion. Ionto + ice was used per patient's request, and patient reported no pain following.     Rehab Potential  Good    Clinical Impairments Affecting Rehab Potential  (-) chronicity of sx, mulitple pain areas in L shoulder (+) motivation, active lifestyle, lack of other comorbidities    PT Frequency  2x / week    PT Duration  8 weeks    PT Treatment/Interventions  Electrical Stimulation;Ultrasound;Cryotherapy;Moist Heat;Iontophoresis /ml Dexamethasone;Neuromuscular re-education;Passive range of  motion;Manual techniques;Dry needling;Functional mobility training;Therapeutic activities;Patient/family education;Taping;Therapeutic exercise    PT Next Visit Plan  treadmill running assessment;  continue RTC and periscapular strengthening as  able;    PT Home Exercise Plan  standing rows, shoulder IR/ER, and tricep stretch; sleeper stretch, SCM stretch    Consulted and Agree with Plan of Care  Patient       Patient will benefit from skilled therapeutic intervention in order to improve the following deficits and impairments:  Increased fascial restricitons, Impaired sensation, Improper body mechanics, Pain, Postural dysfunction, Decreased mobility, Decreased endurance, Decreased range of motion, Decreased strength, Impaired UE functional use  Visit Diagnosis: Stiffness of left shoulder, not elsewhere classified     Problem List Patient Active Problem List   Diagnosis Date Noted  . Osteopenia 06/20/2017   Staci Acosta PT, DPT Staci Acosta 05/01/2018, 6:22 PM  [place] [facility] PHYSICAL AND SPORTS MEDICINE [address] [place], [address] Phone: [phone]   Fax:  [phone]  Name: YOLETTE HASTINGS MRN: [id number] Date of Birth: 08-Nov-1956

## 2018-05-03 ENCOUNTER — Encounter: Payer: Self-pay | Admitting: Physical Therapy

## 2018-05-03 ENCOUNTER — Ambulatory Visit: Payer: 59 | Admitting: Physical Therapy

## 2018-05-03 DIAGNOSIS — M25612 Stiffness of left shoulder, not elsewhere classified: Secondary | ICD-10-CM

## 2018-05-03 NOTE — Therapy (Signed)
[facility] [address], [place], [address] Phone: [phone]   Fax:  [phone]  Physical Therapy Treatment  Patient Details  Name: Susan Chaney MRN: [id number] Date of Birth: [birth year] Referring Provider: Tania Ade, MD   Encounter Date: 05/03/2018  PT End of Session - 05/03/18 1037    Visit Number  13    Number of Visits  29    Date for PT Re-Evaluation  06/14/18    PT Start Time  1030    PT Stop Time  1115    PT Time Calculation (min)  45 min    Equipment Utilized During Treatment  Other (comment)    Activity Tolerance  Patient tolerated treatment well    Behavior During Therapy  [facility] for tasks assessed/performed       Past Medical History:  Diagnosis Date  . Acid reflux   . Anxiety   . IBS (irritable bowel syndrome)   . Insomnia   . Osteopenia   . Osteoporosis   . Solitary cyst of breast     Past Surgical History:  Procedure Laterality Date  . BREAST BIOPSY Left 2001   Negative. 2 areas.   . CHOLECYSTECTOMY  03/2009  . COLONOSCOPY  2009   Dr Elliot/ hemorrhoids  . COLPOSCOPY  09/13/2002  . TUBAL LIGATION  10/04/1983    There were no vitals filed for this visit.  Subjective Assessment - 05/03/18 1035    Subjective  Patient reports minimal pain today (2/10) and that she is "less sore". Patient reports she saw her surgeon yesterday, who was impressed with her motion. Patient reports compliance with her HEP and no questions/concerns at this time.     Pertinent History  Patient is a pleasant year old female s/p R manipulation with capsular release 3/26 with supraspinatous repair, and subsequent finding of biceps tendinopathy. Patient reports she also having a nerve block 3/26 which she reports she is still having some numbess in the shoulder and pins and needles in the fingers. Patient reports surgeon instructed her to wear brace 3 days, other than to complete exercises (AAROM  flexion/abd). Patient reports she returns to see surgeon Wed. Patient notes no pain since sugery    Limitations  House hold activities;Reading;Walking;Lifting    How long can you sit comfortably?  unlimited    How long can you stand comfortably?  unlimited    How long can you walk comfortably?  unlimited    Diagnostic tests  MRI and ultrasound evidence of 90% partial thickness supraspinatus RCT and biceps tendinopathy     Patient Stated Goals  Return to bootcamp gym classes, running    Pain Onset  In the past 7 days                               PT Education - 05/03/18 1057    Education provided  Yes    Education Details  Exercise form    Person(s) Educated  Patient    Methods  Explanation;Demonstration;Tactile cues;Verbal cues    Comprehension  Verbalized understanding;Verbal cues required;Tactile cues required       PT Short Term Goals - 05/03/18 1038      PT SHORT TERM GOAL #1   Title   Pt will be independent with HEP in order to improve strength and ROM to improve function at home and work.    Time  6  Period  Weeks    Status  Achieved        PT Long Term Goals - 05/03/18 1038      PT LONG TERM GOAL #1   Title   Patient will increase FOTO score to 70 to demonstrate predicted increase in functional mobility to complete ADLs    Baseline  5/9 72    Time  6    Period  Days    Status  Achieved      PT LONG TERM GOAL #2   Title  Pt will decrease quick DASH score by at least 8% in order to demonstrate clinically significant reduction in disability.    Baseline  5/9: 18.2%    Time  6    Period  Weeks    Status  Achieved      PT LONG TERM GOAL #3   Title  Patient will demonstrate symmetrical L shoulder ROM wnl in order to complete ADLs    Baseline  5/9 Full ROM, reports she is able to complete ADLs    Time  6    Period  Weeks    Status  Achieved      PT LONG TERM GOAL #4   Title  Patient will demonstrate gross L shoulder strength symmetrical  to R side in order to safely participate in bootcamp exercise classes    Baseline  3/28 flexion L: 4+/5 w/ pain R: 5/5; abd L: 4+/5 w/ pain R: 5/5; IR L: 5/5 w/ pain R: 5/5; ER L: 4/5 w/ pain R: 5/5    Time  6    Period  Weeks    Status  On-going          Ther-Ex -UBE 2 min forward 2 min backward for warmup (unbilled) -YTI 3x 12 3x 12 with 1# wt for T and I position and min cuing for proper scapula alignment -Pendent ER with 3# wt 1x 12; 1z 10 and 2# 1x 12 with TC cues for proper shoulder positioning without compensation -Full can abduction 3x 12 with cuing initially for proper ROM -High rows 3x 10 with TC cuing for correct scapular movement -POC update explained with new goals oriented at RTC strengthening and ensuring safety before returning to [place] - 05/03/18 1114    Clinical Impression Statement  Patient's goals were reassessed today. Patient has met all ROM goals with slight "pulling in ant shoulder" with some motions, and is able to complete all ADLs at this time. Patient is still lacking some strength in R RTC as opposed to L, and is concerned about safe return to bootcamp this summer. PT will continue to focus on RTC strengthening, especially in the way of ER and ABD as these are the most difficult to ensure safe return to exercise regimen. PT led patient through resistance exercises, with no noted pain; only soreness, requiring some cuing to prevent compensation and ensure proper form. PT focused on ER rotators as this is patients weakest and most painful motion at this time, and advised patient to ice as needed if exercise cause soreness.    Rehab Potential  Good    Clinical Impairments Affecting Rehab Potential  (-) chronicity of sx, mulitple pain areas in L shoulder (+) motivation, active lifestyle, lack of other comorbidities    PT Frequency  2x / week    PT Duration  8 weeks    PT Treatment/Interventions  Electrical Stimulation;Ultrasound;Cryotherapy;Moist  Heat;Iontophoresis 56m/ml Dexamethasone;Neuromuscular re-education;Passive range of motion;Manual techniques;Dry needling;Functional  mobility training;Therapeutic activities;Patient/family education;Taping;Therapeutic exercise    PT Next Visit Plan  treadmill running assessment;  continue RTC and periscapular strengthening as able;    PT Home Exercise Plan  standing rows, shoulder IR/ER, and tricep stretch; sleeper stretch, SCM stretch    Consulted and Agree with Plan of Care  Patient       Patient will benefit from skilled therapeutic intervention in order to improve the following deficits and impairments:  Increased fascial restricitons, Impaired sensation, Improper body mechanics, Pain, Postural dysfunction, Decreased mobility, Decreased endurance, Decreased range of motion, Decreased strength, Impaired UE functional use  Visit Diagnosis: Stiffness of left shoulder, not elsewhere classified - Plan: PT plan of care cert/re-cert     Problem List Patient Active Problem List   Diagnosis Date Noted  . Osteopenia 06/20/2017   Shelton Silvas PT, DPT Shelton Silvas 05/03/2018, 11:51 AM  [facility] [address], [place], [address] Phone: [phone]   Fax:  [phone]  Name: Susan Chaney MRN: [id number] Date of Birth: 1956/08/14

## 2018-05-04 ENCOUNTER — Encounter: Payer: 59 | Admitting: Physical Therapy

## 2018-05-07 ENCOUNTER — Ambulatory Visit: Payer: 59 | Admitting: Physical Therapy

## 2018-05-09 ENCOUNTER — Ambulatory Visit: Payer: 59 | Admitting: Physical Therapy

## 2018-05-10 ENCOUNTER — Encounter: Payer: 59 | Admitting: Physical Therapy

## 2018-05-11 ENCOUNTER — Encounter: Payer: 59 | Admitting: Physical Therapy

## 2018-05-11 ENCOUNTER — Ambulatory Visit: Payer: 59 | Admitting: Physical Therapy

## 2018-05-11 ENCOUNTER — Encounter: Payer: Self-pay | Admitting: Physical Therapy

## 2018-05-11 DIAGNOSIS — M25612 Stiffness of left shoulder, not elsewhere classified: Secondary | ICD-10-CM

## 2018-05-11 NOTE — Therapy (Signed)
[facility] [address] [place], [address] Phone: [phone]   Fax:  [phone]  Physical Therapy Treatment  Patient Details  Name: Susan Chaney MRN: [id number] Date of Birth: 09/06/1956 Referring Provider: Jones Broom, MD   Encounter Date: 05/11/2018  PT End of Session - 05/11/18 1054    Visit Number  14    Number of Visits  29    Date for PT Re-Evaluation  06/14/18    PT Start Time  1048    PT Stop Time  1130    PT Time Calculation (min)  42 min    Activity Tolerance  Patient tolerated treatment well    Behavior During Therapy  [facility] for tasks assessed/performed       Past Medical History:  Diagnosis Date  . Acid reflux   . Anxiety   . IBS (irritable bowel syndrome)   . Insomnia   . Osteopenia   . Osteoporosis   . Solitary cyst of breast     Past Surgical History:  Procedure Laterality Date  . BREAST BIOPSY Left 2001   Negative. 2 areas.   . CHOLECYSTECTOMY  03/2009  . COLONOSCOPY  2009   Dr Elliot/ hemorrhoids  . COLPOSCOPY  09/13/2002  . TUBAL LIGATION  10/04/1983    There were no vitals filed for this visit.  Subjective Assessment - 05/11/18 1052    Subjective  Patient reports minimal pain today, and reports her pain is getting "better and better". She reports that she has been running more, with no pain, which she is very excited about. Patient reports she still would like to ensure she has adequate strength to safely complete bootcamp at her gym the second week in June.     Pertinent History  Patient is a pleasant year old female s/p R manipulation with capsular release 3/26 with supraspinatous repair, and subsequent finding of biceps tendinopathy. Patient reports she also having a nerve block 3/26 which she reports she is still having some numbess in the shoulder and pins and needles in the fingers. Patient reports surgeon instructed her to wear brace 3 days, other than to  complete exercises (AAROM flexion/abd). Patient reports she returns to see surgeon Wed. Patient notes no pain since sugery    Limitations  House hold activities;Reading;Walking;Lifting    How long can you sit comfortably?  unlimited    How long can you stand comfortably?  unlimited    How long can you walk comfortably?  unlimited    Diagnostic tests  MRI and ultrasound evidence of 90% partial thickness supraspinatus RCT and biceps tendinopathy     Patient Stated Goals  Return to bootcamp gym classes, running    Pain Onset  In the past 7 days          Ther-Ex -UBE warmup forward backward for warm-up (unbilled) -Total gym pull up level 22 2x 12 with cuing for eccentric control; level 23 1x 10  -Military press 3x 10 5# in each hand with min TC for proper ROM -Empty can 2# 3x 10 with min cuing for proper ROM -Standing ER in 90/90 position 2# 3x 10 with cuing to maintain 90/90 position -Standing rows 15# 3x 10 with min cuing initially needed for proper form -Standing tricep push downs with lat pulldown bar 15# 3x 10 with min cuing for proper form  PT Education - 05/11/18 1054    Education provided  Yes    Education Details  Exercise form    Person(s) Educated  Patient    Methods  Explanation;Demonstration;Verbal cues    Comprehension  Verbal cues required;Returned demonstration;Verbalized understanding       PT Short Term Goals - 05/03/18 1038      PT SHORT TERM GOAL #1   Title   Pt will be independent with HEP in order to improve strength and ROM to improve function at home and work.    Time  6    Period  Weeks    Status  Achieved        PT Long Term Goals - 05/03/18 1038      PT LONG TERM GOAL #1   Title   Patient will increase FOTO score to 70 to demonstrate predicted increase in functional mobility to complete ADLs    Baseline  5/9 72    Time  6    Period  Days    Status  Achieved      PT LONG TERM GOAL #2   Title  Pt  will decrease quick DASH score by at least 8% in order to demonstrate clinically significant reduction in disability.    Baseline  5/9: 18.2%    Time  6    Period  Weeks    Status  Achieved      PT LONG TERM GOAL #3   Title  Patient will demonstrate symmetrical L shoulder ROM wnl in order to complete ADLs    Baseline  5/9 Full ROM, reports she is able to complete ADLs    Time  6    Period  Weeks    Status  Achieved      PT LONG TERM GOAL #4   Title  Patient will demonstrate gross L shoulder strength symmetrical to R side in order to safely participate in bootcamp exercise classes    Baseline  3/28 flexion L: 4+/5 w/ pain R: 5/5; abd L: 4+/5 w/ pain R: 5/5; IR L: 5/5 w/ pain R: 5/5; ER L: 4/5 w/ pain R: 5/5    Time  6    Period  Weeks    Status  On-going            Plan - 05/11/18 1105    Clinical Impression Statement  PT continued to lead patient through higher level strengthening exercises, needed to ensure safety with returning to bootcamp at her gym. Patient was able to complete all therex with no increased pain, only noted muscle fatigue, with min cuing from PT for proper form; which patient was able to correct 100% by the end of her sets.     Rehab Potential  Good    Clinical Impairments Affecting Rehab Potential  (-) chronicity of sx, mulitple pain areas in L shoulder (+) motivation, active lifestyle, lack of other comorbidities    PT Frequency  2x / week    PT Duration  8 weeks    PT Treatment/Interventions  Electrical Stimulation;Ultrasound;Cryotherapy;Moist Heat;Iontophoresis /ml Dexamethasone;Neuromuscular re-education;Passive range of motion;Manual techniques;Dry needling;Functional mobility training;Therapeutic activities;Patient/family education;Taping;Therapeutic exercise    PT Next Visit Plan  treadmill running assessment;  continue RTC and periscapular strengthening as able;    PT Home Exercise Plan  standing rows, shoulder IR/ER, and tricep stretch; sleeper  stretch, SCM stretch    Consulted and Agree with Plan of Care  Patient       Patient will benefit from  skilled therapeutic intervention in order to improve the following deficits and impairments:  Increased fascial restricitons, Impaired sensation, Improper body mechanics, Pain, Postural dysfunction, Decreased mobility, Decreased endurance, Decreased range of motion, Decreased strength, Impaired UE functional use  Visit Diagnosis: Stiffness of left shoulder, not elsewhere classified     Problem List Patient Active Problem List   Diagnosis Date Noted  . Osteopenia 06/20/2017   Staci Acosta PT, DPT Staci Acosta 05/11/2018, 11:41 AM  [facility] [address], [place], [address] Phone: [phone]   Fax:  [phone]  Name: Susan Chaney MRN: [id number] Date of Birth: June 09, 1956

## 2018-05-16 ENCOUNTER — Ambulatory Visit: Payer: 59 | Admitting: Physical Therapy

## 2018-05-16 ENCOUNTER — Encounter: Payer: Self-pay | Admitting: Physical Therapy

## 2018-05-16 DIAGNOSIS — M25612 Stiffness of left shoulder, not elsewhere classified: Secondary | ICD-10-CM | POA: Diagnosis not present

## 2018-05-16 DIAGNOSIS — M25511 Pain in right shoulder: Secondary | ICD-10-CM

## 2018-05-16 NOTE — Therapy (Signed)
[facility] [address] [place], [address] Phone: [phone]   Fax:  [phone]  Physical Therapy Treatment  Patient Details  Name: Susan Chaney MRN: [id number] Date of Birth: 1956/05/08 Referring Provider: Jones Broom, MD   Encounter Date: 05/16/2018  PT End of Session - 05/16/18 1856    Visit Number  15    Number of Visits  29    Date for PT Re-Evaluation  06/14/18    PT Start Time  0145    PT Stop Time  0240    PT Time Calculation (min)  55 min    Activity Tolerance  Patient tolerated treatment well    Behavior During Therapy  [facility] for tasks assessed/performed       Past Medical History:  Diagnosis Date  . Acid reflux   . Anxiety   . IBS (irritable bowel syndrome)   . Insomnia   . Osteopenia   . Osteoporosis   . Solitary cyst of breast     Past Surgical History:  Procedure Laterality Date  . BREAST BIOPSY Left 2001   Negative. 2 areas.   . CHOLECYSTECTOMY  03/2009  . COLONOSCOPY  2009   Dr Elliot/ hemorrhoids  . COLPOSCOPY  09/13/2002  . TUBAL LIGATION  10/04/1983    There were no vitals filed for this visit.  Subjective Assessment - 05/16/18 1351    Subjective  Patient reports slight increase in pain today, which she reports is from pulling her 42 year old granddaughter in a wagon over the weekend. Patient reports 3/10 pain, which she describes as soreness.  Patient brought a list of exercises she wants to be able to complete safely for bootcamp.     Pertinent History  Patient is a pleasant year old female s/p R manipulation with capsular release 3/26 with supraspinatous repair, and subsequent finding of biceps tendinopathy. Patient reports she also having a nerve block 3/26 which she reports she is still having some numbess in the shoulder and pins and needles in the fingers. Patient reports surgeon instructed her to wear brace 3 days, other than to complete exercises (AAROM  flexion/abd). Patient reports she returns to see surgeon Wed. Patient notes no pain since sugery    Limitations  House hold activities;Reading;Walking;Lifting    How long can you sit comfortably?  unlimited    How long can you stand comfortably?  unlimited    How long can you walk comfortably?  unlimited    Diagnostic tests  MRI and ultrasound evidence of 90% partial thickness supraspinatus RCT and biceps tendinopathy     Patient Stated Goals  Return to bootcamp gym classes, running    Pain Onset  In the past 7 days            Ther-Ex -UBE warm up forward backward for warm up (unbilled) Went over the following exercises from pt list giving following cues and modifications -Medicine ball smash:6.6lb ball reps for proper form to utilize both UE evenly as patient favors her RUE b/c her LUE has a deformity where it is not able to extend the elbow. Patient completed these for 2 mins per bootcamp protocol with proper form and no pain, only fatigue following. PT advised patient she may attempt this with no more than 10lb in bootcamp class. -Push Up: Pt able to complete push up from knees with proper form for before pain (bootcamp stations are ). PT advised patient to  do smaller ROM with pushup when she has pain, or to modify with wall push up, which patient demonstrated with accuracy -Shoulder tap in plank: patient is unable to support upper body on L shoulder for R UE "tapping" without pain, and is unable to support upper body on R shoulder d/t prior deformity. PT advised patient to forgo this and modify with stationary plank or with LE march in plank -Bicep curl: with min cuing from PT, patient able to complete for for 7# wt, but reports this is "challenging". PT advised patient to continue with 7# or less  - [employer]: patient able to complete with proper form utilizing cuing from prior session with 5lbs with 20sec break after 1 min. PT advises patient to use 5lbs in  each hand and break between if she experiences severe pain/fatigue -Mountain climbers: Patient unable to complete from ext elbows without pain/with proper form. PT modified exercise to mountain climbers from elbows and on wall with forearm support, which patient is able to complete, alternating from floor elbows, to wall modification.  -Tricep dips: patient unable to complete tricep dip with straight legs d/t increased pain. PT modified this to tricep dips from bench with bend LE so that patient can use LE as needed to take pressure off the shoulder. With this modification patient is able to complete for 2 mins with correct form, with 1 15sec break -Ice at the end of session unbilled to reduce post-exercise soreness.                   PT Education - 05/16/18 1855    Education provided  Yes    Education Details  Exercise form and modification    Person(s) Educated  Patient    Methods  Explanation;Verbal cues;Tactile cues    Comprehension  Verbalized understanding;Returned demonstration;Verbal cues required;Tactile cues required       PT Short Term Goals - 05/03/18 1038      PT SHORT TERM GOAL #1   Title   Pt will be independent with HEP in order to improve strength and ROM to improve function at home and work.    Time  6    Period  Weeks    Status  Achieved        PT Long Term Goals - 05/03/18 1038      PT LONG TERM GOAL #1   Title   Patient will increase FOTO score to 70 to demonstrate predicted increase in functional mobility to complete ADLs    Baseline  5/9 72    Time  6    Period  Days    Status  Achieved      PT LONG TERM GOAL #2   Title  Pt will decrease quick DASH score by at least 8% in order to demonstrate clinically significant reduction in disability.    Baseline  5/9: 18.2%    Time  6    Period  Weeks    Status  Achieved      PT LONG TERM GOAL #3   Title  Patient will demonstrate symmetrical L shoulder ROM wnl in order to complete ADLs     Baseline  5/9 Full ROM, reports she is able to complete ADLs    Time  6    Period  Weeks    Status  Achieved      PT LONG TERM GOAL #4   Title  Patient will demonstrate gross L shoulder strength symmetrical to R  side in order to safely participate in bootcamp exercise classes    Baseline  3/28 flexion L: 4+/5 w/ pain R: 5/5; abd L: 4+/5 w/ pain R: 5/5; IR L: 5/5 w/ pain R: 5/5; ER L: 4/5 w/ pain R: 5/5    Time  6    Period  Weeks    Status  On-going            Plan - 05/16/18 1959    Clinical Impression Statement  PT reviewed list patient brought of exercises she needs to be able to complete at her bootcamp class for 2 minute circuits. PT reviewed patient's form and ability to maintain proper form to ensure safety. Patient was able to complete exercises with proper form following PT cuing, and accepted PT modifications to exercises to ensure safety for exercises she is unable to complete with proper form. PT utilized ice at the end of session to reduce post-exercise soreness. Next session PT will finish review of bootcamp exercises, and assess post session soreness to gauge effectiveness of modifications on post-exercise pain., while still allowing patient to fully participate in her active lifestyle    Rehab Potential  Good    Clinical Impairments Affecting Rehab Potential  (-) chronicity of sx, mulitple pain areas in L shoulder (+) motivation, active lifestyle, lack of other comorbidities    PT Frequency  2x / week    PT Duration  8 weeks    PT Next Visit Plan  Continue bootcamp exercises review, assess post session soreness/pain to gauge effectiveness of modifications to reduce pain    PT Home Exercise Plan  standing rows, shoulder IR/ER, and tricep stretch; sleeper stretch, SCM stretch    Consulted and Agree with Plan of Care  Patient       Patient will benefit from skilled therapeutic intervention in order to improve the following deficits and impairments:  Increased fascial  restricitons, Impaired sensation, Improper body mechanics, Pain, Postural dysfunction, Decreased mobility, Decreased endurance, Decreased range of motion, Decreased strength, Impaired UE functional use  Visit Diagnosis: Acute pain of right shoulder  Stiffness of left shoulder, not elsewhere classified     Problem List Patient Active Problem List   Diagnosis Date Noted  . Osteopenia 06/20/2017   Staci Acosta PT, DPT Staci Acosta 05/16/2018, 8:08 PM  [facility] [address] [place], [address] Phone: [phone]   Fax:  [phone]  Name: Susan Chaney MRN: [id number] Date of Birth: 08/17/1956

## 2018-05-23 ENCOUNTER — Encounter: Payer: Self-pay | Admitting: Physical Therapy

## 2018-05-23 ENCOUNTER — Ambulatory Visit: Payer: 59 | Admitting: Physical Therapy

## 2018-05-23 DIAGNOSIS — M25612 Stiffness of left shoulder, not elsewhere classified: Secondary | ICD-10-CM

## 2018-05-23 NOTE — Therapy (Signed)
[place] [facility]ICINE [address], [place], [address] Phone: [phone]   Fax:  [phone]  Physical Therapy Treatment  Patient Details  Name: LATAISHA COLAN MRN: [id number] Date of Birth: 1956/05/02 Referring Provider: Jones Broom, MD   Encounter Date: 05/23/2018  PT End of Session - 05/23/18 1359    Visit Number  16    Number of Visits  29    Date for PT Re-Evaluation  06/14/18    PT Start Time  0145    PT Stop Time  0230    PT Time Calculation (min)  45 min    Activity Tolerance  Patient tolerated treatment well    Behavior During Therapy  [facility] for tasks assessed/performed       Past Medical History:  Diagnosis Date  . Acid reflux   . Anxiety   . IBS (irritable bowel syndrome)   . Insomnia   . Osteopenia   . Osteoporosis   . Solitary cyst of breast     Past Surgical History:  Procedure Laterality Date  . BREAST BIOPSY Left 2001   Negative. 2 areas.   . CHOLECYSTECTOMY  03/2009  . COLONOSCOPY  2009   Dr Elliot/ hemorrhoids  . COLPOSCOPY  09/13/2002  . TUBAL LIGATION  10/04/1983    There were no vitals filed for this visit.  Subjective Assessment - 05/23/18 1351    Subjective  Patient reports she thinks she is "not ready for bootcamp" and she was extremely sore following last session, and is still somewhat sore today. She reports she was on vacation and doing more activity over the holiday weekend but is still very sore, especially at the incision sites at ant/post shoulder. Patient reports her pain was increased to 6/10 with increased soreness, and is a 3/10 today. Patient reports she has been running regularly with no issues.     Pertinent History  Patient is a pleasant year old female s/p R manipulation with capsular release 3/26 with supraspinatous repair, and subsequent finding of biceps tendinopathy. Patient reports she also having a nerve block 3/26 which she reports she is still having  some numbess in the shoulder and pins and needles in the fingers. Patient reports surgeon instructed her to wear brace 3 days, other than to complete exercises (AAROM flexion/abd). Patient reports she returns to see surgeon Wed. Patient notes no pain since sugery    Limitations  House hold activities;Reading;Walking;Lifting    How long can you sit comfortably?  unlimited    How long can you stand comfortably?  unlimited    How long can you walk comfortably?  unlimited    Diagnostic tests  MRI and ultrasound evidence of 90% partial thickness supraspinatus RCT and biceps tendinopathy     Patient Stated Goals  Return to bootcamp gym classes, running    Pain Onset  In the past 7 days           Manual -STM with trigger point release to mid trap/infraspinatus near insertion and R UT (prolonged time spent here as these are the areas of the most tender with increased trigger points -STM with trigger point release to R latissimus/teres minor with 75% decreased tension following -Scar massage to ant/post incision as patient reports increased "achiness" here     ESTIM + heat pack [employer] ESTIM 15 min at patient tolerated 125V increased to 135V at R mid trap/infraspinatus and 80V increased to 90V at R UT . Attempted to  decrease trigger points and muscle spasm in these areas. With PT assessing patient tolerance throughout (decreasing intensity as needed), monitoring skin integrity (normal), with decreased pain noted from patient                      PT Education - 05/23/18 1354    Education provided  Yes    Education Details  Pain modulation; ESTIM    Person(s) Educated  Patient    Methods  Explanation;Verbal cues    Comprehension  Verbal cues required;Verbalized understanding       PT Short Term Goals - 05/03/18 1038      PT SHORT TERM GOAL #1   Title   Pt will be independent with HEP in order to improve strength and ROM to improve function at home and work.    Time  6     Period  Weeks    Status  Achieved        PT Long Term Goals - 05/03/18 1038      PT LONG TERM GOAL #1   Title   Patient will increase FOTO score to 70 to demonstrate predicted increase in functional mobility to complete ADLs    Baseline  5/9 72    Time  6    Period  Days    Status  Achieved      PT LONG TERM GOAL #2   Title  Pt will decrease quick DASH score by at least 8% in order to demonstrate clinically significant reduction in disability.    Baseline  5/9: 18.2%    Time  6    Period  Weeks    Status  Achieved      PT LONG TERM GOAL #3   Title  Patient will demonstrate symmetrical L shoulder ROM wnl in order to complete ADLs    Baseline  5/9 Full ROM, reports she is able to complete ADLs    Time  6    Period  Weeks    Status  Achieved      PT LONG TERM GOAL #4   Title  Patient will demonstrate gross L shoulder strength symmetrical to R side in order to safely participate in bootcamp exercise classes    Baseline  3/28 flexion L: 4+/5 w/ pain R: 5/5; abd L: 4+/5 w/ pain R: 5/5; IR L: 5/5 w/ pain R: 5/5; ER L: 4/5 w/ pain R: 5/5    Time  6    Period  Weeks    Status  On-going            Plan - 05/23/18 1428    Clinical Impression Statement  Following manual + modality techniques patient reports no pain and trigger points/soft tissue tightness resolved 75%. PT encouraged patient ot continue HEP stretching to maintain gains in pain reduction/muscle lengthening. PT will resume functional RTC strengthening next session as able.     Rehab Potential  Good    Clinical Impairments Affecting Rehab Potential  (-) chronicity of sx, mulitple pain areas in L shoulder (+) motivation, active lifestyle, lack of other comorbidities    PT Frequency  2x / week    PT Duration  8 weeks    PT Treatment/Interventions  Electrical Stimulation;Ultrasound;Cryotherapy;Moist Heat;Iontophoresis /ml Dexamethasone;Neuromuscular re-education;Passive range of motion;Manual techniques;Dry  needling;Functional mobility training;Therapeutic activities;Patient/family education;Taping;Therapeutic exercise    PT Next Visit Plan  Continue bootcamp exercises review, assess post session soreness/pain to gauge effectiveness of modifications to reduce pain  PT Home Exercise Plan  standing rows, shoulder IR/ER, and tricep stretch; sleeper stretch, SCM stretch    Consulted and Agree with Plan of Care  Patient       Patient will benefit from skilled therapeutic intervention in order to improve the following deficits and impairments:  Increased fascial restricitons, Impaired sensation, Improper body mechanics, Pain, Postural dysfunction, Decreased mobility, Decreased endurance, Decreased range of motion, Decreased strength, Impaired UE functional use  Visit Diagnosis: Stiffness of left shoulder, not elsewhere classified     Problem List Patient Active Problem List   Diagnosis Date Noted  . Osteopenia 06/20/2017   Staci Acosta PT, DPT Staci Acosta 05/23/2018, 2:30 PM  [place] [facility] PHYSICAL AND SPORTS MEDICINE [address], [place], [address] Phone: [phone]   Fax:  [phone]  Name: ANDE THERRELL MRN: [id number] Date of Birth: 1956-06-06

## 2018-05-24 ENCOUNTER — Ambulatory Visit: Payer: 59 | Admitting: Physical Therapy

## 2018-05-30 ENCOUNTER — Ambulatory Visit: Payer: 59 | Attending: Orthopedic Surgery | Admitting: Physical Therapy

## 2018-05-30 ENCOUNTER — Encounter: Payer: Self-pay | Admitting: Physical Therapy

## 2018-05-30 DIAGNOSIS — M25612 Stiffness of left shoulder, not elsewhere classified: Secondary | ICD-10-CM

## 2018-05-30 NOTE — Therapy (Signed)
[facility] [address] , [place], [address] Phone: [phone]   Fax:  [phone]  Physical Therapy Treatment  Patient Details  Name: Susan Chaney MRN: [id number] Date of Birth: May 08, 1956 Referring Provider: Jones BroomJustin Chandler, MD   Encounter Date: 05/30/2018  PT End of Session - 05/30/18 1315    Visit Number  17    Number of Visits  29    Date for PT Re-Evaluation  06/14/18    PT Start Time  0100    PT Stop Time  0145    PT Time Calculation (min)  45 min    Activity Tolerance  Patient tolerated treatment well    Behavior During Therapy  [facility]       Past Medical History:  Diagnosis Date  . Acid reflux   . Anxiety   . IBS (irritable bowel syndrome)   . Insomnia   . Osteopenia   . Osteoporosis   . Solitary cyst of breast     Past Surgical History:  Procedure Laterality Date  . BREAST BIOPSY Left 2001   Negative. 2 areas.   . CHOLECYSTECTOMY  03/2009  . COLONOSCOPY  2009   Dr Elliot/ hemorrhoids  . COLPOSCOPY  09/13/2002  . TUBAL LIGATION  10/04/1983    There were no vitals filed for this visit.  Subjective Assessment - 05/30/18 1306    Subjective  Patient reports she has been doing well with decreased pain, and more of a "stiffness" sensation and some soreness following exercise. Patient reports no pain today.     Pertinent History  Patient is a pleasant year old female s/p R manipulation with capsular release 3/26 with supraspinatous repair, and subsequent finding of biceps tendinopathy. Patient reports she also having a nerve block 3/26 which she reports she is still having some numbess in the shoulder and pins and needles in the fingers. Patient reports surgeon instructed her to wear brace 3 days, other than to complete exercises (AAROM flexion/abd). Patient reports she returns to see surgeon Wed. Patient notes no pain since sugery    Limitations  House hold  activities;Reading;Walking;Lifting    How long can you sit comfortably?  unlimited    How long can you stand comfortably?  unlimited    How long can you walk comfortably?  unlimited    Diagnostic tests  MRI and ultrasound evidence of 90% partial thickness supraspinatus RCT and biceps tendinopathy     Patient Stated Goals  Return to bootcamp gym classes, running    Pain Onset  In the past 7 days         Ther-Ex -[employer] gym pull ups 1x 12 L23;  1x 10 1x9  L 24; 1x 6 L25 -Standing ER with red tband 3x 11; attempted 90/90 position and standing with blue tband- 90/90 produced "sharp pain" so discontinued/ with blue band patient unable to demonstrate proper form so discontinued  -Tricep pushdown with lat pulldown bar 17lb 3x 10 with min  -Serratus punch 3x 10 with min cuing for proper scap retraction -Sleeper stretch 2x 30sec hold with printout given to add to HEP -PT noticed increase need for cuing to prevent shoulder hiking with all therex today, and attributed supraspinatus/UT tightness to this. Encouraged pt to be mindful of proper shoulder positioning with therex/running.   Manual -STM with trigger point release to L supraspinatus/UT and teres minor/major with increased ROM noted following with objective 75% less trigger points/tightness  PT Education - 05/30/18 1313    Education provided  Yes    Education Details  Exercise from    [employer]) Educated  Patient    Methods  Explanation;Tactile cues;Verbal cues    Comprehension  Verbalized understanding;Verbal cues required;Returned demonstration       PT Short Term Goals - 05/03/18 1038      PT SHORT TERM GOAL #1   Title   Pt will be independent with HEP in order to improve strength and ROM to improve function at home and work.    Time  6    Period  Weeks    Status  Achieved        PT Long Term Goals - 05/03/18 1038      PT LONG TERM GOAL #1   Title   Patient will increase FOTO score to 70  to demonstrate predicted increase in functional mobility to complete ADLs    Baseline  5/9 72    Time  6    Period  Days    Status  Achieved      PT LONG TERM GOAL #2   Title  Pt will decrease quick DASH score by at least 8% in order to demonstrate clinically significant reduction in disability.    Baseline  5/9: 18.2%    Time  6    Period  Weeks    Status  Achieved      PT LONG TERM GOAL #3   Title  Patient will demonstrate symmetrical L shoulder ROM wnl in order to complete ADLs    Baseline  5/9 Full ROM, reports she is able to complete ADLs    Time  6    Period  Weeks    Status  Achieved      PT LONG TERM GOAL #4   Title  Patient will demonstrate gross L shoulder strength symmetrical to R side in order to safely participate in bootcamp exercise classes    Baseline  3/28 flexion L: 4+/5 w/ pain R: 5/5; abd L: 4+/5 w/ pain R: 5/5; IR L: 5/5 w/ pain R: 5/5; ER L: 4/5 w/ pain R: 5/5    Time  6    Period  Weeks    Status  On-going            Plan - 05/30/18 1435    Clinical Impression Statement  Patient was able to progress peri-scapular strenghtening with min cuing from PT for proper form with increasing resistance. Patient is having difficulty progressing resisterd ER d/t tightness/pain in teres minor. PT utilized manual techniques on teres minor as well as supraspinatus, which reduced tension in these areas 75%. PT encouraged patient to maintain manual therapy gains with IR stretching and prevention of shoulder hiking with home therex/running (PT blieves this is the source of supraspinatus/UT tension.     Rehab Potential  Good    Clinical Impairments Affecting Rehab Potential  (-) chronicity of sx, mulitple pain areas in L shoulder (+) motivation, active lifestyle, lack of other comorbidities    PT Frequency  2x / week    PT Duration  8 weeks    PT Treatment/Interventions  Electrical Stimulation;Ultrasound;Cryotherapy;Moist Heat;Iontophoresis 4mg /ml  Dexamethasone;Neuromuscular re-education;Passive range of motion;Manual techniques;Dry needling;Functional mobility training;Therapeutic activities;Patient/family education;Taping;Therapeutic exercise    PT Next Visit Plan  Continue periscapular strengthening with focus on scap depressors; continue RTC strenghtening as tolerated, manual/ST techniques as needed    PT Home Exercise Plan  standing rows, shoulder IR/ER, and tricep stretch;  sleeper stretch, SCM stretch    Consulted and Agree with Plan of Care  Patient       Patient will benefit from skilled therapeutic intervention in order to improve the following deficits and impairments:  Increased fascial restricitons, Impaired sensation, Improper body mechanics, Pain, Postural dysfunction, Decreased mobility, Decreased endurance, Decreased range of motion, Decreased strength, Impaired UE functional use  Visit Diagnosis: Stiffness of left shoulder, not elsewhere classified     Problem List Patient Active Problem List   Diagnosis Date Noted  . Osteopenia 06/20/2017   Staci Acosta PT, DPT Staci Acosta 05/30/2018, 2:47 PM  [place] [facility] PHYSICAL AND SPORTS MEDICINE [address] [place], [address] Phone: [phone]   Fax:  [phone]  Name: Susan Chaney MRN: [id number] Date of Birth: 1956-06-16

## 2018-06-06 ENCOUNTER — Encounter: Payer: Self-pay | Admitting: Physical Therapy

## 2018-06-06 ENCOUNTER — Ambulatory Visit: Payer: 59 | Admitting: Physical Therapy

## 2018-06-06 DIAGNOSIS — M25612 Stiffness of left shoulder, not elsewhere classified: Secondary | ICD-10-CM | POA: Diagnosis not present

## 2018-06-06 NOTE — Therapy (Signed)
[facility] [address] [place], [place], [address] Phone: [phone]   Fax:  [phone]  Physical Therapy Treatment/Discharge Summary  Patient Details  Name: Susan QuanVirginia M Mooneyhan MRN: [id number] Date of Birth: 1956/09/05 Referring Provider: Jones BroomJustin Chandler, MD   Encounter Date: 06/06/2018  PT End of Session - 06/06/18 1307    Visit Number  18    Number of Visits  29    Date for PT Re-Evaluation  06/14/18    PT Start Time  0100    PT Stop Time  0145    PT Time Calculation (min)  45 min    Equipment Utilized During Treatment  Other (comment)    Activity Tolerance  Patient tolerated treatment well    Behavior During Therapy  [facility] for tasks assessed/performed       Past Medical History:  Diagnosis Date  . Acid reflux   . Anxiety   . IBS (irritable bowel syndrome)   . Insomnia   . Osteopenia   . Osteoporosis   . Solitary cyst of breast     Past Surgical History:  Procedure Laterality Date  . BREAST BIOPSY Left 2001   Negative. 2 areas.   . CHOLECYSTECTOMY  03/2009  . COLONOSCOPY  2009   Dr Elliot/ hemorrhoids  . COLPOSCOPY  09/13/2002  . TUBAL LIGATION  10/04/1983    There were no vitals filed for this visit.  Subjective Assessment - 06/06/18 1304    Subjective  Patient reports she has minimal pain in the evening following activity, but she is able to manage this on her own. Patient reports she is almost able to run her normal regimen, and feels good about this. Patient reports she feels as though she is able to d/c PT at this time to a HEP.     Pertinent History  Patient is a pleasant year old female s/p R manipulation with capsular release 3/26 with supraspinatous repair, and subsequent finding of biceps tendinopathy. Patient reports she also having a nerve block 3/26 which she reports she is still having some numbess in the shoulder and pins and needles in the fingers. Patient reports surgeon instructed  her to wear brace 3 days, other than to complete exercises (AAROM flexion/abd). Patient reports she returns to see surgeon Wed. Patient notes no pain since sugery    Limitations  House hold activities;Reading;Walking;Lifting    How long can you sit comfortably?  unlimited    How long can you stand comfortably?  unlimited    How long can you walk comfortably?  unlimited    Diagnostic tests  MRI and ultrasound evidence of 90% partial thickness supraspinatus RCT and biceps tendinopathy     Patient Stated Goals  Return to bootcamp gym classes, running    Pain Onset  In the past 7 days         Ther-Ex -Reviewed 1x 10 of: high row with blue band, neutral row with blue band, IR/ER with red band, tricep ext red band, YTI BW, weighted scaption 2#, military press 5#, bicep curl 7# -Gave patient printout of all exercises as well with education on frequency of resistance training for shoulder girdle/scapular strengthening (sets of 3-5 reps of 5-10 3-4x/week) ensuring patient understanding -Reviewed stretching with education on frequency 2x/day each stretch 45-60sec holds, with emphasis on stretching before and after exercising/running -Reviewed bootcamp modifications for previous session. Pt is signed up for July bootcamp class, given PT's contact info to contact clinic  for any other questions/concerns -HEP printout provided of all therex                       PT Education - 06/06/18 1307    Education provided  Yes    Education Details  d/c reccommendations     Person(s) Educated  Patient    Methods  Explanation;Demonstration;Handout    Comprehension  Verbal cues required;Returned demonstration;Verbalized understanding;Tactile cues required       PT Short Term Goals - 06/06/18 1308      PT SHORT TERM GOAL #1   Title   Pt will be independent with HEP in order to improve strength and ROM to improve function at home and work.    Period  Weeks    Status  Achieved        PT  Long Term Goals - 06/06/18 1309      PT LONG TERM GOAL #1   Title   Patient will increase FOTO score to 70 to demonstrate predicted increase in functional mobility to complete ADLs    Baseline  5/9 72    Time  6    Period  Days    Status  Achieved      PT LONG TERM GOAL #2   Title  Pt will decrease quick DASH score by at least 8% in order to demonstrate clinically significant reduction in disability.    Baseline  5/9: 18.2%    Time  6    Period  Weeks    Status  Achieved      PT LONG TERM GOAL #3   Title  Patient will demonstrate symmetrical L shoulder ROM wnl in order to complete ADLs    Baseline  5/9 Full ROM, reports she is able to complete ADLs    Time  6    Period  Weeks    Status  Achieved      PT LONG TERM GOAL #4   Title  Patient will demonstrate gross L shoulder strength symmetrical to R side in order to safely participate in bootcamp exercise classes    Baseline  3/28 flexion 5/5 bilat; abd 5/5 bilat; IR L: 5/5 bilat; ER 5/5    Time  6    Period  Weeks    Status  Achieved            Plan - 06/06/18 1338    Clinical Impression Statement  Patient is reporting minimal pain with increased activity (described as stiffness) that she is able to manage at this time. Patient and PT feel as though she is able to d/c to HEP as patient is very compliant with therex and is able to complete all exercises with correct form with min-no cuing needed from PT. PT reviewed HEP with patient to ensure proper form and understanding of frequency, intensity, etc. Patient demonstrated and verbalized understanding of all education. PT educated patient to contact clinic should any further questions/concerns arise.     Rehab Potential  Good    Clinical Impairments Affecting Rehab Potential  (-) chronicity of sx, mulitple pain areas in L shoulder (+) motivation, active lifestyle, lack of other comorbidities    PT Frequency  2x / week    PT Duration  8 weeks    PT Treatment/Interventions   Electrical Stimulation;Ultrasound;Cryotherapy;Moist Heat;Iontophoresis 4mg /ml Dexamethasone;Neuromuscular re-education;Passive range of motion;Manual techniques;Dry needling;Functional mobility training;Therapeutic activities;Patient/family education;Taping;Therapeutic exercise    PT Next Visit Plan  Continue periscapular strengthening with focus on scap  depressors; continue RTC strenghtening as tolerated, manual/ST techniques as needed    Consulted and Agree with Plan of Care  Patient       Patient will benefit from skilled therapeutic intervention in order to improve the following deficits and impairments:  Increased fascial restricitons, Impaired sensation, Improper body mechanics, Pain, Postural dysfunction, Decreased mobility, Decreased endurance, Decreased range of motion, Decreased strength, Impaired UE functional use  Visit Diagnosis: Stiffness of left shoulder, not elsewhere classified     Problem List Patient Active Problem List   Diagnosis Date Noted  . Osteopenia 06/20/2017   Staci Acosta PT, DPT Staci Acosta 06/06/2018, 1:41 PM  [place] [facility] PHYSICAL AND SPORTS MEDICINE [address] [place], [address] Phone: [phone]   Fax:  [phone]  Name: KYREN VAUX MRN: [id number] Date of Birth: 11-Dec-1956

## 2018-06-11 ENCOUNTER — Other Ambulatory Visit: Payer: Self-pay | Admitting: Internal Medicine

## 2018-06-11 DIAGNOSIS — Z1231 Encounter for screening mammogram for malignant neoplasm of breast: Secondary | ICD-10-CM

## 2018-07-03 ENCOUNTER — Ambulatory Visit
Admission: RE | Admit: 2018-07-03 | Discharge: 2018-07-03 | Disposition: A | Discharge status: Home / Self Care | Payer: 59 | Source: Ambulatory Visit | Attending: Internal Medicine | Admitting: Internal Medicine

## 2018-07-03 DIAGNOSIS — Z1231 Encounter for screening mammogram for malignant neoplasm of breast: Secondary | ICD-10-CM

## 2019-06-17 ENCOUNTER — Other Ambulatory Visit: Payer: Self-pay | Admitting: Internal Medicine

## 2019-06-17 DIAGNOSIS — Z1231 Encounter for screening mammogram for malignant neoplasm of breast: Secondary | ICD-10-CM

## 2019-07-02 ENCOUNTER — Other Ambulatory Visit (HOSPITAL_COMMUNITY)
Admission: RE | Admit: 2019-07-02 | Discharge: 2019-07-02 | Disposition: A | Discharge status: Home / Self Care | Payer: Medicare Other | Source: Ambulatory Visit | Attending: Advanced Practice Midwife | Admitting: Advanced Practice Midwife

## 2019-07-02 ENCOUNTER — Other Ambulatory Visit: Payer: Self-pay

## 2019-07-02 ENCOUNTER — Ambulatory Visit (INDEPENDENT_AMBULATORY_CARE_PROVIDER_SITE_OTHER): Payer: Medicare Other | Admitting: Advanced Practice Midwife

## 2019-07-02 ENCOUNTER — Encounter: Payer: Self-pay | Admitting: Advanced Practice Midwife

## 2019-07-02 VITALS — BP 130/84 | HR 82 | Ht 63.5 in | Wt 113.0 lb

## 2019-07-02 DIAGNOSIS — Z124 Encounter for screening for malignant neoplasm of cervix: Secondary | ICD-10-CM | POA: Insufficient documentation

## 2019-07-02 DIAGNOSIS — Z01419 Encounter for gynecological examination (general) (routine) without abnormal findings: Secondary | ICD-10-CM | POA: Diagnosis present

## 2019-07-02 NOTE — Progress Notes (Addendum)
Gynecology Annual Exam  PCP: Danella PentonMiller, Mark F, MD  Chief Complaint:  Chief Complaint  Patient presents with  . Gynecologic Exam    History of Present Illness:Patient is a 63 y.o. G2P1102 presents for annual exam. The patient has no gyn complaints today.   She has diarrhea occasionally which she attributes to her cholecystectomy. She has retired recently and is doing well staying busy with her grandchildren and [place] She has some anxiety related to Covid restrictions. She had shoulder surgery in the past year and has recovered well.   LMP: No LMP recorded. Patient is postmenopausal. She has not had any bleeding Postcoital Bleeding: no Dysmenorrhea: not applicable  The patient is sexually active. She denies dyspareunia.  The patient doesperform self breast exams.  There is notable family history of breast or ovarian cancer in her family. She has 2 or 3 cousins in the same family on her maternal side with breast cancer. She will find out exactly if it was 2 or 3 and let us know so we can consider My Risk.   The patient wears seatbelts: yes.   The patient has regular exercise: She is active running/walking and taking care of grandchildren. She admits healthy diet and adequate hydration. She has 7-8 hours of sleep per night but it is interrupted.    The patient denies current symptoms of depression.     Review of Systems: Review of Systems  Constitutional: Negative.   HENT: Negative.   Eyes: Negative.   Respiratory: Negative.   Cardiovascular: Negative.   Gastrointestinal: Positive for diarrhea.  Genitourinary: Negative.   Musculoskeletal: Negative.   Skin: Negative.   Neurological: Negative.   Endo/Heme/Allergies: Negative.   Psychiatric/Behavioral: Negative.     Past Medical History:  Past Medical History:  Diagnosis Date  . Acid reflux   . Anxiety   . IBS (irritable bowel syndrome)   . Insomnia   . Osteopenia   . Osteoporosis   . Solitary cyst of  breast     Past Surgical History:  Past Surgical History:  Procedure Laterality Date  . BREAST BIOPSY Left 2001   Negative. 2 areas.   . CHOLECYSTECTOMY  03/2009  . COLONOSCOPY  2009   Dr Elliot/ hemorrhoids  . COLPOSCOPY  09/13/2002  . SHOULDER SURGERY  02/2018  . TUBAL LIGATION  10/04/1983    Gynecologic History:  No LMP recorded. Patient is postmenopausal. Last Pap: 2 years ago Results were:  no abnormalities  Last mammogram: 1 year ago Results were: BI-RAD I  Obstetric History: [id number]: G2P1102  Family History:  Family History  Problem Relation Age of Onset  . Hypertension Mother   . Osteoporosis Mother   . Hyperlipidemia Mother   . Hypertension Father   . Breast cancer Paternal Aunt [id number]       post men.  . Diabetes Sister   . Colon cancer Maternal Grandfather   . Breast cancer Cousin        2 mat cousins--35 and 3545 (sisters)    Social History:  Social History   Socioeconomic History  . Marital status: Married    Spouse name: Not on file  . Number of children: 2  . Years of education: Not on file  . Highest education level: Not on file  Occupational History  . 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 Financial resource strain: Not on file  . Food insecurity    Worry: Not on file  Inability: Not on file  . Transportation needs    Medical: Not on file    Non-medical: Not on file  Tobacco Use  . Smoking status: Never Smoker  . Smokeless tobacco: Never Used  Substance and Sexual Activity  . Alcohol use: Yes  . Drug use: No  . Sexual activity: Yes    Birth control/protection: Post-menopausal  Lifestyle  . Physical activity    Days per week: Not on file    Minutes per session: Not on file  . Stress: Not on file  Relationships  . Social Musicianconnections    Talks on phone: Not on file    Gets together: Not on file    Attends religious service: Not on file    Active member of club or organization: Not on file    Attends meetings of clubs or  organizations: Not on file    Relationship status: Not on file  . Intimate partner violence    Fear of current or ex partner: Not on file    Emotionally abused: Not on file    Physically abused: Not on file    Forced sexual activity: Not on file  Other Topics Concern  . Not on file  Social History Narrative  . Not on file    Allergies:  Allergies  Allergen Reactions  . Codeine Nausea Only  . Ciprofloxacin Anxiety and Nausea Only    Medications: Prior to Admission medications   Medication Sig Start Date End Date Taking? Authorizing Provider  ALPRAZolam Prudy Feeler(XANAX) 0.25 MG tablet  05/15/17  Yes [provider]  Calcium Carbonate-Vitamin D (CALTRATE 600+D) 600-400 MG-UNIT tablet Take by mouth.   Yes [provider]  diphenoxylate-atropine (LOMOTIL) 2.5-0.025 MG tablet  05/12/17  Yes [provider]  vitamin B-12 (CYANOCOBALAMIN) 1000 MCG tablet Take by mouth.   Yes [provider]  pantoprazole (PROTONIX) 40 MG tablet Take by mouth. 05/12/17   [provider]  Probiotic Product ([facility] PROBIOTIC) CHEW Chew by mouth.    [provider]  temazepam (RESTORIL) 15 MG capsule Take by mouth. 05/12/17   [provider]    Physical Exam Vitals: Blood pressure 130/84, pulse 82, height 5' 3.5" (1.613 m), weight 113 lb (51.3 kg).  General: NAD HEENT: normocephalic, anicteric Thyroid: no enlargement, no palpable nodules Pulmonary: No increased work of breathing, CTAB Cardiovascular: RRR, distal pulses 2+ Breast: Breast symmetrical, no tenderness, no palpable nodules or masses, no skin or nipple retraction present, no nipple discharge.  No axillary or supraclavicular lymphadenopathy. Abdomen: NABS, soft, non-tender, non-distended.  Umbilicus without lesions.  No hepatomegaly, splenomegaly or masses palpable. No evidence of hernia  Genitourinary:  External: Normal external female genitalia.  Normal urethral meatus, normal Bartholin's and  Skene's glands.    Vagina: Normal vaginal mucosa, no evidence of prolapse.    Cervix: Grossly normal in appearance, no bleeding, no CMT  Uterus: deferred for no concerns   Adnexa: deferred for no concerns  Rectal: deferred  Lymphatic: no evidence of inguinal lymphadenopathy Extremities: no edema, erythema, or tenderness Neurologic: Grossly intact Psychiatric: mood appropriate, affect full     Assessment: 63 y.o. G2P1102 routine annual exam  Plan: Problem List Items Addressed This Visit    None    Visit Diagnoses    Well woman exam with routine gynecological exam    -  Primary   Relevant Orders   Cytology - PAP   Cervical cancer screening       Relevant Orders   Cytology -  PAP      1) Mammogram - recommend yearly screening mammogram.  Mammogram is scheduled for September of this year  2) STI screening  was offered and declined  3) ASCCP guidelines and rational discussed.  Patient opts for yearly screening interval  4) Osteoporosis  - per USPTF routine screening DEXA at age 98   Consider FDA-approved medical therapies in postmenopausal women and men aged 62 years and older, based on the following: a) A hip or vertebral (clinical or morphometric) fracture b) T-score ? -2.5 at the femoral neck or spine after appropriate evaluation to exclude secondary causes C) Low bone mass (T-score between -1.0 and -2.5 at the femoral neck or spine) and a 10-year probability of a hip fracture ? 3% or a 10-year probability of a major osteoporosis-related fracture ? 20% based on the [place]-adapted WHO algorithm   5) Routine healthcare maintenance including cholesterol, diabetes screening discussed managed by PCP  6) Colonoscopy is up to date per patient and result was normal.  Screening recommended starting at age 32 for average risk individuals, age 30 for individuals deemed at increased risk (including African Americans) and recommended to continue until age 30.  For patient age 51-85  individualized approach is recommended.  Gold standard screening is via colonoscopy, Cologuard screening is an acceptable alternative for patient unwilling or unable to undergo colonoscopy.  "Colorectal cancer screening for average?risk adults: 2018 guideline update from the American Cancer Society"CA: A Cancer Journal for Clinicians: May 24, 2017   7) Return in about 1 year (around 07/01/2020) for annual established gyn.    Rod Can, [facility] 07/02/2019, 1:14 PM

## 2019-07-02 NOTE — Patient Instructions (Signed)
Health Maintenance, Female Adopting a healthy lifestyle and getting preventive care are important in promoting health and wellness. Ask your health care provider about:  The right schedule for you to have regular tests and exams.  Things you can do on your own to prevent diseases and keep yourself healthy. What should I know about diet, weight, and exercise? Eat a healthy diet   Eat a diet that includes plenty of vegetables, fruits, low-fat dairy products, and lean protein.  Do not eat a lot of foods that are high in solid fats, added sugars, or sodium. Maintain a healthy weight Body mass index (BMI) is used to identify weight problems. It estimates body fat based on height and weight. Your health care provider can help determine your BMI and help you achieve or maintain a healthy weight. Get regular exercise Get regular exercise. This is one of the most important things you can do for your health. Most adults should:  Exercise for at least 150 minutes each week. The exercise should increase your heart rate and make you sweat (moderate-intensity exercise).  Do strengthening exercises at least twice a week. This is in addition to the moderate-intensity exercise.  Spend less time sitting. Even light physical activity can be beneficial. Watch cholesterol and blood lipids Have your blood tested for lipids and cholesterol at 65 years of age, then have this test every 5 years. Have your cholesterol levels checked more often if:  Your lipid or cholesterol levels are high.  You are older than 65 years of age.  You are at high risk for heart disease. What should I know about cancer screening? Depending on your health history and family history, you may need to have cancer screening at various ages. This may include screening for:  Breast cancer.  Cervical cancer.  Colorectal cancer.  Skin cancer.  Lung cancer. What should I know about heart disease, diabetes, and high blood  pressure? Blood pressure and heart disease  High blood pressure causes heart disease and increases the risk of stroke. This is more likely to develop in people who have high blood pressure readings, are of African descent, or are overweight.  Have your blood pressure checked: ? Every 3-5 years if you are 18-39 years of age. ? Every year if you are 40 years old or older. Diabetes Have regular diabetes screenings. This checks your fasting blood sugar level. Have the screening done:  Once every three years after age 40 if you are at a normal weight and have a low risk for diabetes.  More often and at a younger age if you are overweight or have a high risk for diabetes. What should I know about preventing infection? Hepatitis B If you have a higher risk for hepatitis B, you should be screened for this virus. Talk with your health care provider to find out if you are at risk for hepatitis B infection. Hepatitis C Testing is recommended for:  Everyone born from 1945 through 1965.  Anyone with known risk factors for hepatitis C. Sexually transmitted infections (STIs)  Get screened for STIs, including gonorrhea and chlamydia, if: ? You are sexually active and are younger than 65 years of age. ? You are older than 65 years of age and your health care provider tells you that you are at risk for this type of infection. ? Your sexual activity has changed since you were last screened, and you are at increased risk for chlamydia or gonorrhea. Ask your health care provider if   you are at risk.  Ask your health care provider about whether you are at high risk for HIV. Your health care provider may recommend a prescription medicine to help prevent HIV infection. If you choose to take medicine to prevent HIV, you should first get tested for HIV. You should then be tested every 3 months for as long as you are taking the medicine. Pregnancy  If you are about to stop having your period (premenopausal) and  you may become pregnant, seek counseling before you get pregnant.  Take 400 to 800 micrograms (mcg) of folic acid every day if you become pregnant.  Ask for birth control (contraception) if you want to prevent pregnancy. Osteoporosis and menopause Osteoporosis is a disease in which the bones lose minerals and strength with aging. This can result in bone fractures. If you are 65 years old or older, or if you are at risk for osteoporosis and fractures, ask your health care provider if you should:  Be screened for bone loss.  Take a calcium or vitamin D supplement to lower your risk of fractures.  Be given hormone replacement therapy (HRT) to treat symptoms of menopause. Follow these instructions at home: Lifestyle  Do not use any products that contain nicotine or tobacco, such as cigarettes, e-cigarettes, and chewing tobacco. If you need help quitting, ask your health care provider.  Do not use street drugs.  Do not share needles.  Ask your health care provider for help if you need support or information about quitting drugs. Alcohol use  Do not drink alcohol if: ? Your health care provider tells you not to drink. ? You are pregnant, may be pregnant, or are planning to become pregnant.  If you drink alcohol: ? Limit how much you use to 0-1 drink a day. ? Limit intake if you are breastfeeding.  Be aware of how much alcohol is in your drink. In the [place] one drink equals one 12 oz bottle of beer (355 mL), one 5 oz glass of wine (148 mL), or one 1 oz glass of hard liquor (44 mL). General instructions  Schedule regular health, dental, and eye exams.  Stay current with your vaccines.  Tell your health care provider if: ? You often feel depressed. ? You have ever been abused or do not feel safe at home. Summary  Adopting a healthy lifestyle and getting preventive care are important in promoting health and wellness.  Follow your health care provider's instructions about healthy  diet, exercising, and getting tested or screened for diseases.  Follow your health care provider's instructions on monitoring your cholesterol and blood pressure. This information is not intended to replace advice given to you by your health care provider. Make sure you discuss any questions you have with your health care provider. Document Released: 06/27/2011 Document Revised: 12/05/2018 Document Reviewed: 12/05/2018 Elsevier Patient Education  2020 Elsevier Inc.  

## 2019-07-08 LAB — CYTOLOGY - PAP
Diagnosis: NEGATIVE
HPV: NOT DETECTED

## 2019-09-03 ENCOUNTER — Ambulatory Visit
Admission: RE | Admit: 2019-09-03 | Discharge: 2019-09-03 | Disposition: A | Discharge status: Home / Self Care | Payer: Medicare Other | Source: Ambulatory Visit | Attending: Internal Medicine | Admitting: Internal Medicine

## 2019-09-03 DIAGNOSIS — Z1231 Encounter for screening mammogram for malignant neoplasm of breast: Secondary | ICD-10-CM | POA: Insufficient documentation

## 2020-02-21 ENCOUNTER — Encounter: Payer: Self-pay | Admitting: Obstetrics & Gynecology

## 2020-02-21 ENCOUNTER — Other Ambulatory Visit: Payer: Self-pay

## 2020-02-21 ENCOUNTER — Ambulatory Visit (INDEPENDENT_AMBULATORY_CARE_PROVIDER_SITE_OTHER): Payer: Medicare Other | Admitting: Obstetrics & Gynecology

## 2020-02-21 VITALS — BP 120/80 | Ht 63.5 in | Wt 117.0 lb

## 2020-02-21 DIAGNOSIS — N9419 Other specified dyspareunia: Secondary | ICD-10-CM | POA: Diagnosis not present

## 2020-02-21 DIAGNOSIS — N952 Postmenopausal atrophic vaginitis: Secondary | ICD-10-CM

## 2020-02-21 MED ORDER — HYDROCORT-PRAMOXINE (PERIANAL) 1-1 % EX FOAM: 1.0000 | Freq: Two times a day (BID) | CUTANEOUS | 11 refills | Status: DC | PRN

## 2020-02-21 MED ORDER — REPLENS VA GEL: 1.0000 | VAGINAL | 11 refills | Status: DC

## 2020-02-21 NOTE — Progress Notes (Signed)
Obstetrics & Gynecology Office Visit   Chief Complaint: Vaginal dryness and pain  History of Present Illness: 64 y.o. G5X6468 presenting for initial evaluation of dyspareunia.  Symptoms onset was several years ago and symptoms have been worsening.  She reports more vaginal burning and itching over the last few weeks.  Also has had hemorrhoid itching pain and sometimes bleeding w fissures over many years, worse after exercise.  The patient is menopausal.  She is not currently on any HRT or hormonal medications.    Pain is most pronounced with initial penetration.  She denies postcoital spotting.  Last pap smear on 2020 was no abnormalities.  She denies a history of sexually transmitted infections.  Associated symptoms include none.  She denies recent antibiotic exposure, denies changes in soaps, detergents coinciding with the onset of her symptoms.  She has not previously self treated or been under treatment by another provider for these symptoms.   Review of Systems  Constitutional: Negative for chills, fever and malaise/fatigue.  HENT: Negative for congestion, sinus pain and sore throat.   Eyes: Negative for blurred vision and pain.  Respiratory: Negative for cough and wheezing.   Cardiovascular: Negative for chest pain and leg swelling.  Gastrointestinal: Negative for abdominal pain, constipation, diarrhea, heartburn, nausea and vomiting.  Genitourinary: Negative for dysuria, frequency, hematuria and urgency.  Musculoskeletal: Negative for back pain, joint pain, myalgias and neck pain.  Skin: Negative for itching and rash.  Neurological: Negative for dizziness, tremors and weakness.  Endo/Heme/Allergies: Does not bruise/bleed easily.  Psychiatric/Behavioral: Negative for depression. The patient is not nervous/anxious and does not have insomnia.     Past Medical History:  Past Medical History:  Diagnosis Date  . Acid reflux   . Anxiety   . IBS (irritable bowel syndrome)   .  Insomnia   . Osteopenia   . Osteoporosis   . Solitary cyst of breast     Past Surgical History:  Past Surgical History:  Procedure Laterality Date  . BREAST BIOPSY Left 2001   Negative. 2 areas.   . CHOLECYSTECTOMY  03/2009  . COLONOSCOPY  2009   Dr Elliot/ hemorrhoids  . COLPOSCOPY  09/13/2002  . SHOULDER SURGERY  02/2018  . TUBAL LIGATION  10/04/1983    Gynecologic History: No LMP recorded. Patient is postmenopausal.  Obstetric History: [id number]  Family History:  Family History  Problem Relation Age of Onset  . Hypertension Mother   . Osteoporosis Mother   . Hyperlipidemia Mother   . Hypertension Father   . Breast cancer Paternal Aunt 44       post men.  . Diabetes Sister   . Colon cancer Maternal Grandfather   . Breast cancer Cousin        2 mat cousins--35 and 51 (sisters)    Social History:  Social History   Socioeconomic History  . Marital status: Widowed    Spouse name: Not on file  . Number of children: 2  . Years of education: Not on file  . Highest education level: Not on file  Occupational History  . 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 Smoking status: Never Smoker  . Smokeless tobacco: Never Used  Substance and Sexual Activity  . Alcohol use: Yes  . Drug use: No  . Sexual activity: Yes    Birth control/protection: Post-menopausal  Other Topics Concern  . Not on file  Social History Narrative  . Not on file   Social Determinants of  Health   Financial Resource Strain:   . Difficulty of Paying Living Expenses: Not on file  Food Insecurity:   . Worried About Charity fundraiser in the Last Year: Not on file  . Ran Out of Food in the Last Year: Not on file  Transportation Needs:   . Lack of Transportation (Medical): Not on file  . Lack of Transportation (Non-Medical): Not on file  Physical Activity:   . Days of Exercise per Week: Not on file  . Minutes of Exercise per Session: Not on file  Stress:   . Feeling of Stress : Not  on file  Social Connections:   . Frequency of Communication with Friends and Family: Not on file  . Frequency of Social Gatherings with Friends and Family: Not on file  . Attends Religious Services: Not on file  . Active Member of Clubs or Organizations: Not on file  . Attends Archivist Meetings: Not on file  . Marital Status: Not on file  Intimate Partner Violence:   . Fear of Current or Ex-Partner: Not on file  . Emotionally Abused: Not on file  . Physically Abused: Not on file  . Sexually Abused: Not on file    Allergies:  Allergies  Allergen Reactions  . Codeine Nausea Only  . Ciprofloxacin Anxiety and Nausea Only    Medications: Prior to Admission medications   Medication Sig Start Date End Date Taking? Authorizing Provider  Calcium Carbonate-Vitamin D (CALTRATE 600+D) 600-400 MG-UNIT tablet Take by mouth.   Yes [provider]  diphenoxylate-atropine (LOMOTIL) 2.5-0.025 MG tablet  05/12/17  Yes [provider]  Probiotic Product ([facility] PROBIOTIC) CHEW Chew by mouth.   Yes [provider]  vitamin B-12 (CYANOCOBALAMIN) 1000 MCG tablet Take by mouth.   Yes [provider]  ALPRAZolam Duanne Moron) 0.25 MG tablet  05/15/17   [provider]  pantoprazole (PROTONIX) 40 MG tablet Take by mouth. 05/12/17   [provider]  temazepam (RESTORIL) 15 MG capsule Take by mouth. 05/12/17   [provider]    Physical Exam Blood pressure 120/80, height 5' 3.5" (1.613 m), weight 117 lb (53.1 kg).  No LMP recorded. Patient is postmenopausal.  General: NAD HEENT: normocephalic, anicteric Thyroid: no enlargement, no palpable nodules Pulmonary: No increased work of breathing Cardiovascular: RRR, distal pulses 2+ Abdomen: NABS, soft, non-tender, non-distended.  Umbilicus without lesions.  No hepatomegaly, splenomegaly or masses palpable. No evidence of hernia  Genitourinary:  External: Normal external female genitalia.   Normal urethral meatus, normal  Bartholin's and Skene's glands.    Vagina: Thin vaginal mucosa, no evidence of prolapse.    Cervix: Grossly normal in appearance, no bleeding  Uterus: Non-enlarged, mobile, normal contour.  No CMT  Adnexa: ovaries non-enlarged, no adnexal masses  Rectal: deferred  Lymphatic: no evidence of inguinal lymphadenopathy Extremities: no edema, erythema, or tenderness Neurologic: Grossly intact Psychiatric: mood appropriate, affect full  Female chaperone present for pelvic  portions of the physical exam  Assessment: 64 y.o. [id number]  Plan: Problem List Items Addressed This Visit    Vaginal atrophy - Primary/ New Concern   Dyspareunia due to medical condition in female        Options for management discussed.    Replens as naturalistic and first line option    Osphena Intrarosa and Vaginal ERT all discussed as well.  Pros, cons, side effects.  She would consider Osphena as second line choice.    Follow up 2 mos to  see how she responds to Replens.     Steroid cream for PRN hemorrhoid sx relief  A total of 30 minutes were spent face-to-face with the patient as well as preparation, review, communication, and documentation during this encounter.   Annamarie Major, MD, [facility], [facility] 02/21/2020  10:12 AM

## 2020-02-21 NOTE — Patient Instructions (Signed)
Atrophic Vaginitis  Atrophic vaginitis is a condition in which the tissues that line the vagina become dry and thin. This condition is most common in women who have stopped having regular menstrual periods (are in menopause). This usually starts when a woman is 32-66 years old. That is the time when a woman's estrogen levels begin to drop (decrease). Estrogen is a female hormone. It helps to keep the tissues of the vagina moist. It stimulates the vagina to produce a clear fluid that lubricates the vagina for sexual intercourse. This fluid also protects the vagina from infection. Lack of estrogen can cause the lining of the vagina to get thinner and dryer. The vagina may also shrink in size. It may become less elastic. Atrophic vaginitis tends to get worse over time as a woman's estrogen level drops. What are the causes? This condition is caused by the normal drop in estrogen that happens around the time of menopause. What increases the risk? Certain conditions or situations may lower a woman's estrogen level, leading to a higher risk for atrophic vaginitis. You are more likely to develop this condition if:  You are taking medicines that block estrogen.  You have had your ovaries removed.  You are being treated for cancer with X-ray (radiation) or medicines (chemotherapy).  You have given birth or are breastfeeding.  You are older than age 66.  You smoke. What are the signs or symptoms? Symptoms of this condition include:  Pain, soreness, or bleeding during sexual intercourse (dyspareunia).  Vaginal burning, irritation, or itching.  Pain or bleeding when a speculum is used in a vaginal exam (pelvic exam).  Having burning pain when passing urine.  Vaginal discharge that is brown or yellow. In some cases, there are no symptoms. How is this diagnosed? This condition is diagnosed by taking a medical history and doing a physical exam. This will include a pelvic exam that checks the  vaginal tissues. Though rare, you may also have other tests, including:  A urine test.  A test that checks the acid balance in your vagina (acid balance test). How is this treated? Treatment for this condition depends on how severe your symptoms are. Treatment may include:  Using an over-the-counter vaginal lubricant before sex.  Using a long-acting vaginal moisturizer.  REPLENS  Ninfa Meeker as non-hormonal options aimed at vaginal dryness and pain with intercourse  Using low-dose vaginal estrogen for moderate to severe symptoms that do not respond to other treatments. Options include creams, tablets, and inserts (vaginal rings). Before you use a vaginal estrogen, tell your health care provider if you have a history of: ? Breast cancer. ? Endometrial cancer. ? Blood clots. If you are not sexually active and your symptoms are very mild, you may not need treatment. Follow these instructions at home: Medicines  Take over-the-counter and prescription medicines only as told by your health care provider. Do not use herbal or alternative medicines unless your health care provider says that you can.  Use over-the-counter creams, lubricants, or moisturizers for dryness only as directed by your health care provider. General instructions  If your atrophic vaginitis is caused by menopause, discuss all of your menopause symptoms and treatment options with your health care provider.  Do not douche.  Do not use products that can make your vagina dry. These include: ? Scented feminine sprays. ? Scented tampons. ? Scented soaps.  Vaginal intercourse can help to improve blood flow and elasticity of vaginal tissue. If it hurts to have sex, try  using a lubricant or moisturizer just before having intercourse. Contact a health care provider if:  Your discharge looks different than normal.  Your vagina has an unusual smell.  You have new symptoms.  Your symptoms do not improve with  treatment.  Your symptoms get worse. Summary  Atrophic vaginitis is a condition in which the tissues that line the vagina become dry and thin. It is most common in women who have stopped having regular menstrual periods (are in menopause).  Treatment options include using vaginal lubricants and low-dose vaginal estrogen.  Contact a health care provider if your vagina has an unusual smell, or if your symptoms get worse or do not improve after treatment. This information is not intended to replace advice given to you by your health care provider. Make sure you discuss any questions you have with your health care provider. Document Revised: 11/24/2017 Document Reviewed: 09/07/2017 Elsevier Patient Education  2020 [employer]  Ospemifene oral tablets What is this medicine? OSPEMIFENE (os PEM i feen) is used to treat vaginal dryness and painful sexual intercourse due to menopause; these symptoms occur due to changes in and around the vagina. This medicine may be used for other purposes; ask your health care provider or pharmacist if you have questions. COMMON BRAND NAME(S): Osphena What should I tell my health care provider before I take this medicine? They need to know if you have any of these conditions:  cancer, such as breast, uterine, or other cancer  heart disease  history of blood clots  history of stroke  history of vaginal bleeding  liver disease  premenopausal  smoke tobacco  an unusual or allergic reaction to ospemifene, other medicines, foods, dyes, or preservatives  pregnant or trying to get pregnant  breast-feeding How should I use this medicine? Take this medicine by mouth with a glass of water. Take this medicine with food. Follow the directions on the prescription label. Do not take your medicine more often than directed. Do not stop taking except on your doctor's advice. Talk to your pediatrician regarding the use of this medicine in children. This  medicine is not approved for use in children. Overdosage: If you think you have taken too much of this medicine contact a poison control center or emergency room at once. NOTE: This medicine is only for you. Do not share this medicine with others. What if I miss a dose? If you miss a dose, take it as soon as you can. If it is almost time for your next dose, take only that dose. Do not take double or extra doses. What may interact with this medicine?  amiodarone  bosentan  carbamazepine  certain medicines for fungal infections like ketoconazole, itraconazole, fluconazole, and voriconazole  certain medicines for HIV or hepatitis  estrogens  glyburide  griseofulvin  mitotane  modafinil  omeprazole  phenobarbital  phenytoin  primidone  raloxifene  rifampin  St. John's wort  tamoxifen  toremifene  warfarin This list may not describe all possible interactions. Give your health care provider a list of all the medicines, herbs, non-prescription drugs, or dietary supplements you use. Also tell them if you smoke, drink alcohol, or use illegal drugs. Some items may interact with your medicine. What should I watch for while using this medicine? Visit your doctor or health care professional for regular checks on your progress. You will need a regular breast and pelvic exam and Pap smear while on this medicine. You should also discuss the need for regular mammograms  with your health care professional, and follow his or her guidelines for these tests. Smoking increases the risk of getting a blood clot or having a stroke while you are taking this medicine. You are strongly advised not to smoke. This medicine does not prevent hot flashes. It may cause hot flashes in some patients. This medicine can increase the risk of developing a condition (endometrial hyperplasia) that may lead to cancer of the lining of the uterus. Taking progestins, another hormone drug, with this medicine  lowers the risk of developing this condition. Therefore, if your uterus has not been removed (by a hysterectomy), your doctor may prescribe a progestin for you to take with this medicine. You should know, however, that taking a progestin may have additional health risks. You should discuss the use of these medicines with your health care professional to determine the benefits and risks for you. If you are going to have surgery, you may need to stop taking this medicine. Consult your health care professional for advice before you schedule the surgery. If you have any reason to think you are pregnant; stop taking this medicine at once and contact your doctor or health care professional. What side effects may I notice from receiving this medicine? Side effects that you should report to your doctor or health care professional as soon as possible:  allergic reactions like skin rash, itching or hives; swelling of the face, lips, or tongue  breathing problems  breast tissue changes or discharge  signs and symptoms of a blood clot such as chest pain; shortness of breath; pain, swelling, or warmth in the leg  signs and symptoms of a stroke like changes in vision; confusion; trouble speaking or understanding; severe headaches; sudden numbness or weakness of the face, arm or leg; trouble walking; dizziness; loss of balance or coordination  vaginal bleeding Side effects that usually do not require medical attention (report to your doctor or health care professional if they continue or are bothersome):  hot flushes or flashes  increased sweating  muscle cramps  vaginal discharge (white or clear) This list may not describe all possible side effects. Call your doctor for medical advice about side effects. You may report side effects to FDA at 1-800-FDA-1088. Where should I keep my medicine? Keep out of the reach of children. Store at room temperature between 20 and 25 degrees C (68 and 77 degrees F).  Protect from light. Keep container tightly closed. Throw away any unused medicine after the expiration date. NOTE: This sheet is a summary. It may not cover all possible information. If you have questions about this medicine, talk to your doctor, pharmacist, or health care provider.  2020 Elsevier/Gold Standard (2018-01-29 13:06:09)

## 2020-03-05 ENCOUNTER — Other Ambulatory Visit: Payer: Self-pay | Admitting: Obstetrics & Gynecology

## 2020-03-05 DIAGNOSIS — K649 Unspecified hemorrhoids: Secondary | ICD-10-CM

## 2020-03-05 MED ORDER — HYDROCORT-PRAMOXINE (PERIANAL) 1-1 % EX FOAM: 1.0000 | Freq: Two times a day (BID) | CUTANEOUS | 11 refills | Status: DC | PRN

## 2020-03-16 ENCOUNTER — Other Ambulatory Visit: Payer: Self-pay | Admitting: Obstetrics & Gynecology

## 2020-03-16 MED ORDER — HYDROCORTISONE (PERIANAL) 2.5 % EX CREA: 1.0000 "application " | TOPICAL_CREAM | Freq: Two times a day (BID) | CUTANEOUS | 0 refills | Status: AC

## 2020-04-21 ENCOUNTER — Encounter: Payer: Self-pay | Admitting: Obstetrics & Gynecology

## 2020-04-21 ENCOUNTER — Ambulatory Visit (INDEPENDENT_AMBULATORY_CARE_PROVIDER_SITE_OTHER): Payer: Medicare Other | Admitting: Obstetrics & Gynecology

## 2020-04-21 ENCOUNTER — Other Ambulatory Visit: Payer: Self-pay

## 2020-04-21 VITALS — Ht 63.0 in | Wt 113.0 lb

## 2020-04-21 DIAGNOSIS — N952 Postmenopausal atrophic vaginitis: Secondary | ICD-10-CM

## 2020-04-21 DIAGNOSIS — N9419 Other specified dyspareunia: Secondary | ICD-10-CM

## 2020-04-21 MED ORDER — OSPHENA 60 MG PO TABS: 1.0000 | ORAL_TABLET | Freq: Every day | ORAL | 11 refills | Status: DC

## 2020-04-21 NOTE — Progress Notes (Signed)
Virtual Visit via Telephone Note  I connected with [place] on 04/21/20 at  8:20 AM EDT by telephone and verified that I am speaking with the correct person using two identifiers.   I discussed the limitations, risks, security and privacy concerns of performing an evaluation and management service by telephone and the availability of in person appointments. I also discussed with the patient that there may be a patient responsible charge related to this service. The patient expressed understanding and agreed to proceed. She was at home and I was in my office.  History of Present Illness:Susan Chaney is a 64 y.o. who was started on Replens approximately 2 months ago. Since that time, she states that her symptoms have not changed as she only took a few dose of the Replens then quit as she had side effects of gel leakage and discomfort from the the day after each dose.  Still has pain w intercourse, using lubricant to try to help.  PMHx: She  has a past medical history of Acid reflux, Anxiety, IBS (irritable bowel syndrome), Insomnia, Osteopenia, Osteoporosis, and Solitary cyst of breast. Also,  has a past surgical history that includes Colposcopy (09/13/2002); Cholecystectomy (03/2009); Tubal ligation (10/04/1983); Breast biopsy (Left, 2001); Colonoscopy (2009); and Shoulder surgery (02/2018)., family history includes Breast cancer in her cousin; Breast cancer (age of onset: 35) in her paternal aunt; Colon cancer in her maternal grandfather; Diabetes in her sister; Hyperlipidemia in her mother; Hypertension in her father and mother; Osteoporosis in her mother.,  reports that she has never smoked. She has never used smokeless tobacco. She reports current alcohol use. She reports that she does not use drugs. Current Meds  Medication Sig  . Calcium Carbonate-Vitamin D (CALTRATE 600+D) 600-400 MG-UNIT tablet Take by mouth.  . diphenoxylate-atropine (LOMOTIL) 2.5-0.025 MG tablet   .  hydrocortisone (PROCTOSOL HC) 2.5 % rectal cream Place 1 application rectally 2 (two) times daily.  . Probiotic Product ([employer] PROBIOTIC) CHEW Chew by mouth.  . vitamin B-12 (CYANOCOBALAMIN) 1000 MCG tablet Take by mouth.  . Also, is allergic to codeine and ciprofloxacin..  Review of Systems  All other systems reviewed and are negative.    Observations/Objective: No exam today, due to telephone eVisit due to [facility] virus restriction on elective visits and procedures.  Prior visits reviewed along with ultrasounds/labs as indicated.  Assessment and Plan:   ICD-10-CM   1. Vaginal atrophy  N95.2 Ospemifene (OSPHENA) 60 MG TABS  2. Dyspareunia due to medical condition in female  N94.19 Ospemifene (OSPHENA) 60 MG TABS  Did not tolerate vaginal medicine well (Replens) Discussed options for Osphena, Andreas Newport, and HRT    Osphena as is oral therapy planned and info provided to pt; eRx done  Follow Up Instructions: F/u 2 mos to assess improvement and tolerability of Osphena Side effects to watch for disucssed   I discussed the assessment and treatment plan with the patient. The patient was provided an opportunity to ask questions and all were answered. The patient agreed with the plan and demonstrated an understanding of the instructions.   The patient was advised to call back or seek an in-person evaluation if the symptoms worsen or if the condition fails to improve as anticipated.  I provided 15 minutes of non-face-to-face time during this encounter.   Letitia Libra, MD

## 2020-04-21 NOTE — Patient Instructions (Signed)
Ospemifene oral tablets What is this medicine? OSPEMIFENE (os PEM i feen) is used to treat vaginal dryness and painful sexual intercourse due to menopause; these symptoms occur due to changes in and around the vagina. This medicine may be used for other purposes; ask your health care provider or pharmacist if you have questions. COMMON BRAND NAME(S): Osphena What should I tell my health care provider before I take this medicine? They need to know if you have any of these conditions:  cancer, such as breast, uterine, or other cancer  heart disease  history of blood clots  history of stroke  history of vaginal bleeding  liver disease  premenopausal  smoke tobacco  an unusual or allergic reaction to ospemifene, other medicines, foods, dyes, or preservatives  pregnant or trying to get pregnant  breast-feeding How should I use this medicine? Take this medicine by mouth with a glass of water. Take this medicine with food. Follow the directions on the prescription label. Do not take your medicine more often than directed. Do not stop taking except on your doctor's advice. Talk to your pediatrician regarding the use of this medicine in children. This medicine is not approved for use in children. Overdosage: If you think you have taken too much of this medicine contact a poison control center or emergency room at once. NOTE: This medicine is only for you. Do not share this medicine with others. What if I miss a dose? If you miss a dose, take it as soon as you can. If it is almost time for your next dose, take only that dose. Do not take double or extra doses. What may interact with this medicine?  amiodarone  bosentan  carbamazepine  certain medicines for fungal infections like ketoconazole, itraconazole, fluconazole, and voriconazole  certain medicines for HIV or  hepatitis  estrogens  glyburide  griseofulvin  mitotane  modafinil  omeprazole  phenobarbital  phenytoin  primidone  raloxifene  rifampin  St. John's wort  tamoxifen  toremifene  warfarin This list may not describe all possible interactions. Give your health care provider a list of all the medicines, herbs, non-prescription drugs, or dietary supplements you use. Also tell them if you smoke, drink alcohol, or use illegal drugs. Some items may interact with your medicine. What should I watch for while using this medicine? Visit your doctor or health care professional for regular checks on your progress. You will need a regular breast and pelvic exam and Pap smear while on this medicine. You should also discuss the need for regular mammograms with your health care professional, and follow his or her guidelines for these tests. Smoking increases the risk of getting a blood clot or having a stroke while you are taking this medicine. You are strongly advised not to smoke. This medicine does not prevent hot flashes. It may cause hot flashes in some patients. This medicine can increase the risk of developing a condition (endometrial hyperplasia) that may lead to cancer of the lining of the uterus. Taking progestins, another hormone drug, with this medicine lowers the risk of developing this condition. Therefore, if your uterus has not been removed (by a hysterectomy), your doctor may prescribe a progestin for you to take with this medicine. You should know, however, that taking a progestin may have additional health risks. You should discuss the use of these medicines with your health care professional to determine the benefits and risks for you. If you are going to have surgery, you may need to   stop taking this medicine. Consult your health care professional for advice before you schedule the surgery. If you have any reason to think you are pregnant; stop taking this medicine at once  and contact your doctor or health care professional. What side effects may I notice from receiving this medicine? Side effects that you should report to your doctor or health care professional as soon as possible:  allergic reactions like skin rash, itching or hives; swelling of the face, lips, or tongue  breathing problems  breast tissue changes or discharge  signs and symptoms of a blood clot such as chest pain; shortness of breath; pain, swelling, or warmth in the leg  signs and symptoms of a stroke like changes in vision; confusion; trouble speaking or understanding; severe headaches; sudden numbness or weakness of the face, arm or leg; trouble walking; dizziness; loss of balance or coordination  vaginal bleeding Side effects that usually do not require medical attention (report to your doctor or health care professional if they continue or are bothersome):  hot flushes or flashes  increased sweating  muscle cramps  vaginal discharge (white or clear) This list may not describe all possible side effects. Call your doctor for medical advice about side effects. You may report side effects to FDA at 1-800-FDA-1088. Where should I keep my medicine? Keep out of the reach of children. Store at room temperature between 20 and 25 degrees C (68 and 77 degrees F). Protect from light. Keep container tightly closed. Throw away any unused medicine after the expiration date. NOTE: This sheet is a summary. It may not cover all possible information. If you have questions about this medicine, talk to your doctor, pharmacist, or health care provider.  2020 Elsevier/Gold Standard (2018-01-29 13:06:09)  

## 2020-06-01 ENCOUNTER — Telehealth: Payer: Self-pay

## 2020-06-01 ENCOUNTER — Other Ambulatory Visit: Payer: Self-pay | Admitting: Obstetrics & Gynecology

## 2020-06-01 MED ORDER — PREMPRO 0.3-1.5 MG PO TABS: 1.0000 | ORAL_TABLET | Freq: Every day | ORAL | 11 refills | Status: AC

## 2020-06-01 NOTE — Telephone Encounter (Signed)
Let her know Rx sent, also MyChart message sent w info on hormones and menopause therapy.

## 2020-06-01 NOTE — Telephone Encounter (Signed)
Osphena not covered by insurance. Can you send in a different RX?

## 2020-06-02 NOTE — Telephone Encounter (Signed)
Pt aware.

## 2020-07-02 DIAGNOSIS — E782 Mixed hyperlipidemia: Secondary | ICD-10-CM | POA: Insufficient documentation

## 2020-09-07 ENCOUNTER — Other Ambulatory Visit: Payer: Self-pay | Admitting: Internal Medicine

## 2020-09-07 DIAGNOSIS — Z1231 Encounter for screening mammogram for malignant neoplasm of breast: Secondary | ICD-10-CM

## 2020-09-14 ENCOUNTER — Ambulatory Visit: Payer: Medicare Other

## 2020-09-15 ENCOUNTER — Other Ambulatory Visit: Payer: Self-pay | Admitting: Family Medicine

## 2020-09-15 DIAGNOSIS — M25311 Other instability, right shoulder: Secondary | ICD-10-CM

## 2020-09-19 ENCOUNTER — Ambulatory Visit (HOSPITAL_COMMUNITY)
Admission: RE | Admit: 2020-09-19 | Discharge: 2020-09-19 | Disposition: A | Discharge status: Home / Self Care | Payer: Medicare Other | Source: Ambulatory Visit | Attending: Family Medicine | Admitting: Family Medicine

## 2020-09-19 ENCOUNTER — Other Ambulatory Visit: Payer: Self-pay

## 2020-09-19 DIAGNOSIS — M25311 Other instability, right shoulder: Secondary | ICD-10-CM | POA: Diagnosis present

## 2020-10-01 ENCOUNTER — Encounter: Payer: Self-pay | Admitting: Physical Therapy

## 2020-10-01 ENCOUNTER — Other Ambulatory Visit: Payer: Self-pay

## 2020-10-01 ENCOUNTER — Ambulatory Visit: Payer: Medicare Other | Attending: Orthopedic Surgery | Admitting: Physical Therapy

## 2020-10-01 DIAGNOSIS — M25511 Pain in right shoulder: Secondary | ICD-10-CM | POA: Insufficient documentation

## 2020-10-01 DIAGNOSIS — M25611 Stiffness of right shoulder, not elsewhere classified: Secondary | ICD-10-CM | POA: Insufficient documentation

## 2020-10-01 NOTE — Therapy (Signed)
[place] [facility] [address] [place], [address] Phone: [phone]   Fax:  [phone]  Physical Therapy Evaluation  Patient Details  Name: Susan Chaney MRN: [id number] Date of Birth: [birth year] No data recorded  Encounter Date: 10/01/2020   PT End of Session - 10/01/20 1527    Visit Number 1    Number of Visits 17    Date for PT Re-Evaluation 11/27/20    PT Start Time 66    PT Stop Time 66    PT Time Calculation (min) 66 min    Activity Tolerance Patient tolerated treatment well    Behavior During Therapy [facility] for tasks assessed/performed           Past Medical History:  Diagnosis Date  . Acid reflux   . Anxiety   . IBS (irritable bowel syndrome)   . Insomnia   . Osteopenia   . Osteoporosis   . Solitary cyst of breast     Past Surgical History:  Procedure Laterality Date  . BREAST BIOPSY Left 2001   Negative. 2 areas.   . CHOLECYSTECTOMY  03/2009  . COLONOSCOPY  2009   Dr Elliot/ hemorrhoids  . COLPOSCOPY  09/13/2002  . SHOULDER SURGERY  02/2018  . TUBAL LIGATION  10/04/1983    There were no vitals filed for this visit.    OBJECTIVE  MUSCULOSKELETAL: Tremor: Normal Bulk: Normal Tone: Normal  Cervical Screen AROM: WFL and painless with overpressure in all planes   Elbow Screen Elbow AROM: WNL bilat  Palpation No pain with palpation to anterior, posterior, lateral, and superior shoulder  Strength R/L 5/5 Shoulder flexion (anterior deltoid/pec major/coracobrachialis, axillary n. (C5-6) and musculocutaneous n. (C5-7)) 5/5 Shoulder abduction (deltoid/supraspinatus, axillary/suprascapular n, C5) 5/5 Shoulder external rotation (infraspinatus/teres minor) 5/5 Shoulder internal rotation (subcapularis/lats/pec major) 5/5 Shoulder extension (posterior deltoid, lats, teres major, axillary/thoracodorsal n.) 5/5 Shoulder horizontal abduction 5/5 Elbow flexion (biceps brachii,  brachialis, brachioradialis, musculoskeletal n, C5-6) 5/5 Elbow extension (triceps, radial n, C7) 5/5 Wrist Extension 5/5 Wrist Flexion 5/5 Finger adduction (interossei, ulnar n, T1) 3+/4+Y lower trap 4+/5 T scapular retractors  AROM R/L 156/180 Shoulder flexion 160/180 Shoulder abduction C8/C8 Shoulder external rotation T7/T7 Shoulder internal rotation 60/60 Shoulder extension *Indicates pain, overpressure performed unless otherwise indicated  PROM R/L 180/180 Shoulder flexion 180/180 Shoulder abduction 90/90 Shoulder external rotation 70/70 Shoulder internal rotation 60/60 Shoulder extension *Indicates pain, overpressure performed unless otherwise indicated  Accessory Motions/Glides Glenohumeral: Posterior: Empty d/t discomfort and decreased overpressure Inferior: normal Anterior: not tested  Acromioclavicular:  All normal  Sternoclavicular: Normal for all  Muscle Length Testing All lengths normal with trigger points at pec musculature  NEUROLOGICAL:  Mental Status Patient is oriented to person, place and time.  Recent memory is intact.  Remote memory is intact.  Attention span and concentration are intact.  Expressive speech is intact.  Patient's fund of knowledge is within normal limits for educational level.   SPECIAL TESTS  Rotator Cuff  Drop Arm Test: Negative Painful Arc (Pain from 60 to 120 degrees scaption): Negative Infraspinatus Muscle Test: Negative   Subacromial Impingement Hawkins-Kennedy: Negative  Painful Arc (Pain from 60 to 120 degrees scaption): Negative  Empty Can: Negative External Rotation Resistance: Negative Horizontal Adduction: Negative Scapular Assist: Negative  Labral Tear Biceps Load II (120 elevation, full ER, 90 elbow flexion, full supination, resisted elbow flexion): Negative  Crank (160 scaption, axial load with IR/ER): Positive RUE Active Compression Test: Positive RUE  Bicep Tendon Pathology Speed (shoulder  flexion to 90, external rotation, full elbow extension, and forearm supination with resistance: Negative Yergason's (resisted shoulder ER and supination/biceps tendon pathology): Negative  Shoulder Instability Sulcus Sign: Negative Anterior Apprehension: Positive RUE   Ther-Ex PT reviewed the following HEP with patient with patient able to demonstrate a set of the following with min cuing for correction needed. PT educated patient on parameters of therex (how/when to inc/decrease intensity, frequency, rep/set range, stretch hold time, and purpose of therex) with verbalized understanding.  Access Code: [email or web address] Supine Pec Stretch - 3 x daily - 7 x weekly - 60 reps Seated Scapular Retraction - 1 x daily - 7 x weekly - 3 sets - 10 reps Isometric Shoulder Flexion at Wall - 1 x daily - 7 x weekly - 10 reps - 10sec hold Standing Isometric Shoulder Abduction with Doorway - Arm Bent - 1 x daily - 7 x weekly - 10 reps - 10sec hold Standing Isometric Shoulder External Rotation with Doorway and Towel Roll - 1 x daily - 7 x weekly - 10 reps - 10sec hold                        Objective measurements completed on examination: See above findings.               PT Education - 10/01/20 1526    Education provided Yes    Education Details Patient was educated on diagnosis, anatomy and pathology involved, prognosis, role of PT, and was given an HEP, demonstrating exercise with proper form following verbal and tactile cues, and was given a paper hand out to continue exercise at home. Pt was educated on and agreed to plan of care.    Person(s) Educated Patient    Methods Explanation;Demonstration;Verbal cues;Tactile cues;Handout    Comprehension Verbalized understanding;Returned demonstration;Verbal cues required;Tactile cues required;Need further instruction            PT Short Term Goals - 10/01/20 1550      PT SHORT TERM GOAL #1   Title  Pt will be independent with HEP  in order to improve strength and ROM to improve function at home and work.    Baseline 10/01/20    Time 4    Period Weeks    Status New             PT Long Term Goals - 10/01/20 1610      PT LONG TERM GOAL #1   Title Patient will increase FOTO score to 73 to demonstrate predicted increase in functional mobility to complete ADLs    Baseline 10/01/20 58    Time 8    Period Days    Status New      PT LONG TERM GOAL #2   Title Pt will decrease worst pain as reported on NPRS to 0/10 in order to demonstrate clinically significant reduction in pain.    Baseline 10/01/20 2/10 pain    Time 6    Period Weeks    Status New      PT LONG TERM GOAL #3   Title Pt will demonstrate R lower trap strength of at least 4+ to demonstrate symmetrical PLOF    Baseline 10/01/20 3+/5    Time 8    Period Weeks    Status New      PT LONG TERM GOAL #5   Status Unable to assess  Plan - 10/01/20 1527    Clinical Impression Statement Pt is a 64 year old female presenting with Hill-Sachs impaction type injury involving the posterior aspect of the humeral head, Bankart lesion also suspected, with torn and partially detached anterior labrum (3:00 to 5:00Position); following ant shoulder dislocation during fall onto R shoulder playing tennis. Impairments in R shoulder pain, decreased overhead mobility, decreased periscapular strength, and decreased activity tolerance. Activity limitations in lifting, pulling, pushing, and overhead reaching; inhibiting full participation in normal exercise regimen and heavy ADLs. Pt will benefit from skilled PT to address aforementioned impairments to return to PLOF.    Personal Factors and Comorbidities Age;Comorbidity 1;Comorbidity 2;Sex;Past/Current Experience    Comorbidities osteopenia, osteoporosis    Examination-Activity Limitations Bed Mobility;Reach Overhead;Lift;Carry;Sleep    Examination-Participation Restrictions Optician, dispensing;Yard Work;Other   exercise regimen   Stability/Clinical Decision Making Evolving/Moderate complexity    Clinical Decision Making Moderate    Rehab Potential Good    PT Frequency 2x / week    PT Duration 8 weeks    PT Treatment/Interventions Electrical Stimulation;Ultrasound;Cryotherapy;Moist Heat;Iontophoresis 4mg /ml Dexamethasone;Neuromuscular re-education;Passive range of motion;Manual techniques;Dry needling;Functional mobility training;Therapeutic activities;Patient/family education;Taping;Therapeutic exercise;Joint Manipulations;Spinal Manipulations;Balance training;ADLs/Self Care Home Management    PT Next Visit Plan HEP reivew    PT Home Exercise Plan supine pec stretch, isometrics, scapular retractions    Consulted and Agree with Plan of Care Patient           Patient will benefit from skilled therapeutic intervention in order to improve the following deficits and impairments:  Increased fascial restricitons, Impaired sensation, Improper body mechanics, Pain, Postural dysfunction, Decreased mobility, Decreased endurance, Decreased range of motion, Decreased strength, Impaired UE functional use, Decreased activity tolerance, Decreased coordination, Hypermobility, Impaired flexibility, Impaired tone  Visit Diagnosis: Acute pain of right shoulder  Stiffness of right shoulder, not elsewhere classified     Problem List Patient Active Problem List   Diagnosis Date Noted  . Vaginal atrophy 02/21/2020  . Osteopenia 06/20/2017  . Adult idiopathic generalized osteoporosis 05/07/2015   07/07/2015 DPT Hilda Lias 10/01/2020, 5:05 PM  [facility] SPORTS MEDICINE [address], [address], [place] Phone: [phone]   Fax:  [phone]  Name: ANNETH BRUNELL MRN: Shireen Quan Date of Birth: Mar 05, 1956

## 2020-10-05 ENCOUNTER — Other Ambulatory Visit: Payer: Self-pay

## 2020-10-05 ENCOUNTER — Encounter: Payer: Self-pay | Admitting: Physical Therapy

## 2020-10-05 ENCOUNTER — Ambulatory Visit: Payer: Medicare Other | Admitting: Physical Therapy

## 2020-10-05 DIAGNOSIS — M25611 Stiffness of right shoulder, not elsewhere classified: Secondary | ICD-10-CM

## 2020-10-05 DIAGNOSIS — M25511 Pain in right shoulder: Secondary | ICD-10-CM

## 2020-10-05 NOTE — Therapy (Signed)
[facility] [address] [place], [address] Phone: [phone]   Fax:  [phone]  Physical Therapy Treatment  Patient Details  Name: Susan Chaney MRN: [id number] Date of Birth: 1956/08/26 No data recorded  Encounter Date: 10/05/2020   PT End of Session - 10/05/20 1132    Visit Number 2    Number of Visits 17    Date for PT Re-Evaluation 11/27/20    PT Start Time 1116    PT Stop Time 1155    PT Time Calculation (min) 39 min    Activity Tolerance Patient tolerated treatment well    Behavior During Therapy [facility] for tasks assessed/performed           Past Medical History:  Diagnosis Date  . Acid reflux   . Anxiety   . IBS (irritable bowel syndrome)   . Insomnia   . Osteopenia   . Osteoporosis   . Solitary cyst of breast     Past Surgical History:  Procedure Laterality Date  . BREAST BIOPSY Left 2001   Negative. 2 areas.   . CHOLECYSTECTOMY  03/2009  . COLONOSCOPY  2009   Dr Elliot/ hemorrhoids  . COLPOSCOPY  09/13/2002  . SHOULDER SURGERY  02/2018  . TUBAL LIGATION  10/04/1983    There were no vitals filed for this visit.   Subjective Assessment - 10/05/20 1118    Subjective Patient reports minimal pain in the shoulder, some pain in the collar bone this am 3/10. Reports good motion in the shoulder and some soreness with isometrics HEP.    Pertinent History Pt is a 64 year old female familar with this clinic presenting with R shoulder ant dislocation 09/12/20 with relocation in ED. Imaging subsequently revealed bankhart lesion, and labrum tear; MD gave patient positive prognosis without surgical intervention. Pt reports aching pain 2/10, that is mostly just with pressure on it sleep on back side of the shoulder and collar bone at the end or the day, when she tries to sleep on it, lifts heavy, and reaches overhead. 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 Pt denies N/V, B&B changes, unexplained weight fluctuation, saddle paresthesia, fever, night sweats, or unrelenting night pain at this time.    Limitations House hold activities;Reading;Walking    How long can you sit comfortably? unlimited    How long can you stand comfortably? unlimited    How long can you walk comfortably? unlimited    Diagnostic tests Large Hill-Sachs impaction type injury involving the posterior aspect of the humeral head. Bony Bankart lesion also suspected Torn and partially detached anterior labrum (3:00 to 5:00 position). The anterior inferior articular cartilage of the glenoid is also abnormal, likely torn and partially detached.    Patient Stated Goals return to exercise regimen    Pain Onset In the past 7 days           Full active ROM at beginning of session  Ther-Ex Nustep seat 7 UE 10 L6 with with UE focus with cuing for SPM >60 Standing ER RTB 3x 10 with towel under elbow for TC cuing, min cuing for set up with good carry over Standing IR with RTB from ER to neutral 3x 10 with excellent carry over of technique Scaption/band pull aparts RTB 3x 10 with evident scapular winging with  return, patient is able to minimally correct with TC and demonstration Serratus wall slides 2x 12 with demonstration prior and min cuing to maintain forward tension with good carry over Upper cut in ER 3# DB 3x 10 with cuing inially for scapular motion with with good carry over                        PT Education - 10/05/20 1126    Education provided Yes    Education Details therex form/technique, HEP review    Person(s) Educated Patient    Methods Explanation;Demonstration;Verbal cues    Comprehension Verbalized understanding;Returned demonstration;Verbal cues required            PT Short Term Goals - 10/01/20 1550       PT SHORT TERM GOAL #1   Title  Pt will be independent with HEP in order to improve strength and ROM to improve function at home and work.    Baseline 10/01/20    Time 4    Period Weeks    Status New             PT Long Term Goals - 10/01/20 1610      PT LONG TERM GOAL #1   Title Patient will increase FOTO score to 73 to demonstrate predicted increase in functional mobility to complete ADLs    Baseline 10/01/20 58    Time 8    Period Days    Status New      PT LONG TERM GOAL #2   Title Pt will decrease worst pain as reported on NPRS to 0/10 in order to demonstrate clinically significant reduction in pain.    Baseline 10/01/20 2/10 pain    Time 6    Period Weeks    Status New      PT LONG TERM GOAL #3   Title Pt will demonstrate R lower trap strength of at least 4+ to demonstrate symmetrical PLOF    Baseline 10/01/20 3+/5    Time 8    Period Weeks    Status New      PT LONG TERM GOAL #5   Status Unable to assess                 Plan - 10/05/20 1145    Clinical Impression Statement PT initiated therex progression for increased scapular and RTC strengthening and stability with success. Pt is able to comply with all cuing for proper technique of therex and to reduce scapular winging with good motivaiton throughout session and no increased pain throughout session. PT will continue progression as able.    Personal Factors and Comorbidities Age;Comorbidity 1;Comorbidity 2;Sex;Past/Current Experience    Comorbidities osteopenia, osteoporosis    Examination-Activity Limitations Bed Mobility;Reach Overhead;Lift;Carry;Sleep    Examination-Participation Restrictions Psychiatric nurse;Yard Work;Other    Stability/Clinical Decision Making Evolving/Moderate complexity    Clinical Decision Making Moderate    Rehab Potential Good    Clinical Impairments Affecting Rehab Potential (-) chronicity of sx, mulitple pain areas in L shoulder (+) motivation, active  lifestyle, lack of other comorbidities    PT Frequency 2x / week    PT Duration 8 weeks    PT Treatment/Interventions Electrical Stimulation;Ultrasound;Cryotherapy;Moist Heat;Iontophoresis 4mg /ml Dexamethasone;Neuromuscular re-education;Passive range of motion;Manual techniques;Dry needling;Functional mobility training;Therapeutic activities;Patient/family education;Taping;Therapeutic exercise;Joint Manipulations;Spinal Manipulations;Balance training;ADLs/Self Care Home Management    PT Next Visit Plan HEP reivew    PT Home Exercise Plan supine pec stretch, isometrics, scapular retractions    Consulted  and Agree with Plan of Care Patient           Patient will benefit from skilled therapeutic intervention in order to improve the following deficits and impairments:  Increased fascial restricitons, Impaired sensation, Improper body mechanics, Pain, Postural dysfunction, Decreased mobility, Decreased endurance, Decreased range of motion, Decreased strength, Impaired UE functional use, Decreased activity tolerance, Decreased coordination, Hypermobility, Impaired flexibility, Impaired tone  Visit Diagnosis: Acute pain of right shoulder  Stiffness of right shoulder, not elsewhere classified     Problem List Patient Active Problem List   Diagnosis Date Noted  . Vaginal atrophy 02/21/2020  . Osteopenia 06/20/2017  . Adult idiopathic generalized osteoporosis 05/07/2015    Susan Chaney 10/05/2020, 1:00 PM  [facility] PHYSICAL AND SPORTS MEDICINE [address], [place], [address] Phone: [phone]   Fax:  [phone]  Name: Susan Chaney MRN: [id number] Date of Birth: [birth year]

## 2020-10-07 ENCOUNTER — Encounter: Payer: Self-pay | Admitting: Physical Therapy

## 2020-10-07 ENCOUNTER — Ambulatory Visit: Payer: Medicare Other | Admitting: Physical Therapy

## 2020-10-07 ENCOUNTER — Other Ambulatory Visit: Payer: Self-pay

## 2020-10-07 DIAGNOSIS — M25511 Pain in right shoulder: Secondary | ICD-10-CM

## 2020-10-07 DIAGNOSIS — M25611 Stiffness of right shoulder, not elsewhere classified: Secondary | ICD-10-CM

## 2020-10-07 NOTE — Therapy (Signed)
[facility] [address] [place], [address] Phone: [phone]   Fax:  [phone]  Physical Therapy Treatment  Patient Details  Name: Susan Chaney MRN: [id number] Date of Birth: [birth year] No data recorded  Encounter Date: 10/07/2020   PT End of Session - 10/07/20 1044    Visit Number 3    Number of Visits 17    Date for PT Re-Evaluation 11/27/20    PT Start Time 1030    PT Stop Time 1110    PT Time Calculation (min) 40 min    Equipment Utilized During Treatment Other (comment)    Activity Tolerance Patient tolerated treatment well    Behavior During Therapy [facility] for tasks assessed/performed           Past Medical History:  Diagnosis Date  . Acid reflux   . Anxiety   . IBS (irritable bowel syndrome)   . Insomnia   . Osteopenia   . Osteoporosis   . Solitary cyst of breast     Past Surgical History:  Procedure Laterality Date  . BREAST BIOPSY Left 2001   Negative. 2 areas.   . CHOLECYSTECTOMY  03/2009  . COLONOSCOPY  2009   Dr Elliot/ hemorrhoids  . COLPOSCOPY  09/13/2002  . SHOULDER SURGERY  02/2018  . TUBAL LIGATION  10/04/1983    There were no vitals filed for this visit.   Subjective Assessment - 10/07/20 1037    Subjective Patient reports no pain in the shoulder, some continued soreness at pec musculature near clavicle insertion. Compliance with HEP.    Pertinent History Pt is a 64 year old female familar with this clinic presenting with R shoulder ant dislocation 09/12/20 with relocation in ED. Imaging subsequently revealed bankhart lesion, and labrum tear; MD gave patient positive prognosis without surgical intervention. Pt reports aching pain 2/10, that is mostly just with pressure on it sleep on back side of the shoulder and collar bone at the end or the day, when she tries to sleep on it, lifts heavy, and reaches overhead. 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 Pt denies N/V, B&B changes, unexplained weight fluctuation, saddle paresthesia, fever, night sweats, or unrelenting night pain at this time.    Limitations House hold activities;Reading;Walking    How long can you sit comfortably? unlimited    How long can you stand comfortably? unlimited    How long can you walk comfortably? unlimited    Diagnostic tests Large Hill-Sachs impaction type injury involving the posterior aspect of the humeral head. Bony Bankart lesion also suspected Torn and partially detached anterior labrum (3:00 to 5:00 position). The anterior inferior articular cartilage of the glenoid is also abnormal, likely torn and partially detached.    Patient Stated Goals return to exercise regimen    Pain Onset In the past 7 days           Full active ROM at beginning of session  Ther-Ex Nustep seat 7 UE 10 L6 with with UE focus with cuing for SPM >60 Standing ER RTB 3x 10 with towel under elbow for TC cuing, min cuing for set up with good carry over Standing IR with RTB from ER to neutral 3x 10 with good carry over from previous session Scaption with RTB pull apart 3x 10 with good carry  over of demo and cuing for scapular retraction with good carry over Standing 90/90 ER in min ROM 3x 12 with TC initially for technique with good carry over OMEGA seated row 25# 3x 10 with min cuing for scapular retraction with good carry over Small body blade perturbation in 90d flex 2x 30sec   rhythm                      PT Education - 10/07/20 1044    Education provided Yes    Education Details therex form/technique    Person(s) Educated Patient    Methods Explanation;Demonstration;Verbal cues    Comprehension Verbalized understanding;Returned demonstration;Verbal cues required            PT Short Term  Goals - 10/01/20 1550      PT SHORT TERM GOAL #1   Title  Pt will be independent with HEP in order to improve strength and ROM to improve function at home and work.    Baseline 10/01/20    Time 4    Period Weeks    Status New             PT Long Term Goals - 10/01/20 1610      PT LONG TERM GOAL #1   Title Patient will increase FOTO score to 73 to demonstrate predicted increase in functional mobility to complete ADLs    Baseline 10/01/20 58    Time 8    Period Days    Status New      PT LONG TERM GOAL #2   Title Pt will decrease worst pain as reported on NPRS to 0/10 in order to demonstrate clinically significant reduction in pain.    Baseline 10/01/20 2/10 pain    Time 6    Period Weeks    Status New      PT LONG TERM GOAL #3   Title Pt will demonstrate R lower trap strength of at least 4+ to demonstrate symmetrical PLOF    Baseline 10/01/20 3+/5    Time 8    Period Weeks    Status New      PT LONG TERM GOAL #5   Status Unable to assess                 Plan - 10/07/20 1047    Clinical Impression Statement PT continued therex progression for increased scapulohumeral rhythm and scapular/RTC strengthening with success. Patient is able to comply with all cuing for proper technique of therex with good motivation, no increased pain, with modifications utilized as needed. PT will continue progression as able.    Personal Factors and Comorbidities Age;Comorbidity 1;Comorbidity 2;Sex;Past/Current Experience    Comorbidities osteopenia, osteoporosis    Examination-Activity Limitations Bed Mobility;Reach Overhead;Lift;Carry;Sleep    Examination-Participation Restrictions Psychiatric nurse;Yard Work;Other    Stability/Clinical Decision Making Evolving/Moderate complexity    Clinical Decision Making Moderate    Rehab Potential Good    Clinical Impairments Affecting Rehab Potential (-) chronicity of sx, mulitple pain areas in L shoulder (+) motivation, active  lifestyle, lack of other comorbidities    PT Frequency 2x / week    PT Duration 8 weeks    PT Treatment/Interventions Electrical Stimulation;Ultrasound;Cryotherapy;Moist Heat;Iontophoresis 4mg /ml Dexamethasone;Neuromuscular re-education;Passive range of motion;Manual techniques;Dry needling;Functional mobility training;Therapeutic activities;Patient/family education;Taping;Therapeutic exercise;Joint Manipulations;Spinal Manipulations;Balance training;ADLs/Self Care Home Management    PT Next Visit Plan HEP reivew    PT Home Exercise Plan supine pec stretch, isometrics, scapular retractions    Consulted and Agree  with Plan of Care Patient           Patient will benefit from skilled therapeutic intervention in order to improve the following deficits and impairments:  Increased fascial restricitons, Impaired sensation, Improper body mechanics, Pain, Postural dysfunction, Decreased mobility, Decreased endurance, Decreased range of motion, Decreased strength, Impaired UE functional use, Decreased activity tolerance, Decreased coordination, Hypermobility, Impaired flexibility, Impaired tone  Visit Diagnosis: Acute pain of right shoulder  Stiffness of right shoulder, not elsewhere classified     Problem List Patient Active Problem List   Diagnosis Date Noted  . Vaginal atrophy 02/21/2020  . Osteopenia 06/20/2017  . Adult idiopathic generalized osteoporosis 05/07/2015   Hilda Lias DPT Hilda Lias 10/07/2020, 11:34 AM  [facility] PHYSICAL AND SPORTS MEDICINE [address], [place], [address] Phone: [phone]   Fax:  [phone]  Name: Susan Chaney MRN: [id number] Date of Birth: 30-Apr-1956

## 2020-10-14 ENCOUNTER — Other Ambulatory Visit: Payer: Self-pay

## 2020-10-14 ENCOUNTER — Encounter: Payer: Self-pay | Admitting: Physical Therapy

## 2020-10-14 ENCOUNTER — Ambulatory Visit: Payer: Medicare Other | Admitting: Physical Therapy

## 2020-10-14 DIAGNOSIS — M25511 Pain in right shoulder: Secondary | ICD-10-CM

## 2020-10-14 DIAGNOSIS — M25611 Stiffness of right shoulder, not elsewhere classified: Secondary | ICD-10-CM

## 2020-10-14 NOTE — Therapy (Signed)
[facility] PH[facility]ICINE [address] [place], [address] Phone: [phone]   Fax:  [phone]  Physical Therapy Treatment  Patient Details  Name: Susan Chaney MRN: [id number] Date of Birth: June 17, 1956 No data recorded  Encounter Date: 10/14/2020   PT End of Session - 10/14/20 1004    Visit Number 4    Number of Visits 17    Date for PT Re-Evaluation 11/27/20    PT Start Time 0952    PT Stop Time 1030    PT Time Calculation (min) 38 min    Activity Tolerance Patient tolerated treatment well    Behavior During Therapy [facility] for tasks assessed/performed           Past Medical History:  Diagnosis Date  . Acid reflux   . Anxiety   . IBS (irritable bowel syndrome)   . Insomnia   . Osteopenia   . Osteoporosis   . Solitary cyst of breast     Past Surgical History:  Procedure Laterality Date  . BREAST BIOPSY Left 2001   Negative. 2 areas.   . CHOLECYSTECTOMY  03/2009  . COLONOSCOPY  2009   Dr Elliot/ hemorrhoids  . COLPOSCOPY  09/13/2002  . SHOULDER SURGERY  02/2018  . TUBAL LIGATION  10/04/1983    There were no vitals filed for this visit.   Subjective Assessment - 10/14/20 0957    Subjective Pt returns to therapy from vacation in [place]. Reports no pain in R shoulder, compliance with HEP and good motion overall.    Pertinent History Pt is a 64 year old female familar with this clinic presenting with R shoulder ant dislocation 09/12/20 with relocation in ED. Imaging subsequently revealed bankhart lesion, and labrum tear; MD gave patient positive prognosis without surgical intervention. Pt reports aching pain 2/10, that is mostly just with pressure on it sleep on back side of the shoulder and collar bone at the end or the day, when she tries to sleep on it, lifts heavy, and reaches overhead. 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 Pt denies N/V, B&B changes, unexplained weight fluctuation, saddle paresthesia, fever, night sweats, or unrelenting night pain at this time.    How long can you sit comfortably? unlimited    How long can you stand comfortably? unlimited    How long can you walk comfortably? unlimited    Diagnostic tests Large Hill-Sachs impaction type injury involving the posterior aspect of the humeral head. Bony Bankart lesion also suspected Torn and partially detached anterior labrum (3:00 to 5:00 position). The anterior inferior articular cartilage of the glenoid is also abnormal, likely torn and partially detached.    Patient Stated Goals return to exercise regimen    Pain Onset In the past 7 days           Full active ROM at beginning of session  Ther-Ex UBE L5 seat 9 L6 for gentle strengthening and ROM Supine serratus punch 3# x12; 5# 2x 12 with cuing for scapular retraction and eccentric control with good carry over Supine 5# hold 90d flex with scap retraction with random mod perturbations 2x 30sec  Sidelying ER 3# 3x 10 with cuing for set up without trunk rotation with good carry over Scapular setting in elevated plantigrade  x12; push up plus in min range x12 OMEGA  seated row 25# 3x 10 with min cuing for scapular retraction with good carry over              PT Education - 10/14/20 1004    Education provided Yes    Education Details therex form/technique    Person(s) Educated Patient    Methods Explanation;Demonstration;Verbal cues    Comprehension Verbalized understanding;Returned demonstration;Verbal cues required            PT Short Term Goals - 10/01/20 1550      PT SHORT TERM GOAL #1   Title  Pt will be independent with HEP in order to improve strength and ROM to improve function at home and work.    Baseline 10/01/20    Time 4    Period Weeks    Status New              PT Long Term Goals - 10/01/20 1610      PT LONG TERM GOAL #1   Title Patient will increase FOTO score to 73 to demonstrate predicted increase in functional mobility to complete ADLs    Baseline 10/01/20 58    Time 8    Period Days    Status New      PT LONG TERM GOAL #2   Title Pt will decrease worst pain as reported on NPRS to 0/10 in order to demonstrate clinically significant reduction in pain.    Baseline 10/01/20 2/10 pain    Time 6    Period Weeks    Status New      PT LONG TERM GOAL #3   Title Pt will demonstrate R lower trap strength of at least 4+ to demonstrate symmetrical PLOF    Baseline 10/01/20 3+/5    Time 8    Period Weeks    Status New      PT LONG TERM GOAL #5   Status Unable to assess                 Plan - 10/14/20 1026    Clinical Impression Statement PT continued therex progression for increased scapulohumeral rhythm, stability, and strength with success. Patient is able to complete therex with porper technique following multimodal cuing, with less scapular winging, good motivation and no increased pain throughout session. PT will continue progression as able.    Personal Factors and Comorbidities Age;Comorbidity 1;Comorbidity 2;Sex;Past/Current Experience    Comorbidities osteopenia, osteoporosis    Examination-Activity Limitations Bed Mobility;Reach Overhead;Lift;Carry;Sleep    Examination-Participation Restrictions Psychiatric nurse;Yard Work;Other    Stability/Clinical Decision Making Evolving/Moderate complexity    Clinical Decision Making Moderate    Rehab Potential Good    Clinical Impairments Affecting Rehab Potential (-) chronicity of sx, mulitple pain areas in L shoulder (+) motivation, active lifestyle, lack of other comorbidities    PT Frequency 2x / week    PT Duration 8 weeks    PT Treatment/Interventions Electrical Stimulation;Ultrasound;Cryotherapy;Moist Heat;Iontophoresis 4mg /ml  Dexamethasone;Neuromuscular re-education;Passive range of motion;Manual techniques;Dry needling;Functional mobility training;Therapeutic activities;Patient/family education;Taping;Therapeutic exercise;Joint Manipulations;Spinal Manipulations;Balance training;ADLs/Self Care Home Management    PT Next Visit Plan HEP reivew    PT Home Exercise Plan supine pec stretch, isometrics, scapular retractions    Consulted and Agree with Plan of Care Patient           Patient will benefit from skilled therapeutic intervention in order to improve the following deficits and impairments:  Increased fascial restricitons, Impaired sensation, Improper body mechanics, Pain, Postural dysfunction, Decreased mobility, Decreased endurance, Decreased range of  motion, Decreased strength, Impaired UE functional use, Decreased activity tolerance, Decreased coordination, Hypermobility, Impaired flexibility, Impaired tone  Visit Diagnosis: Acute pain of right shoulder  Stiffness of right shoulder, not elsewhere classified     Problem List Patient Active Problem List   Diagnosis Date Noted  . Vaginal atrophy 02/21/2020  . Osteopenia 06/20/2017  . Adult idiopathic generalized osteoporosis 05/07/2015   Hilda Lias DPT Hilda Lias 10/14/2020, 11:00 AM  [facility] PHYSICAL AND SPORTS MEDICINE [address] [place], [address] Phone: [phone]   Fax:  [phone]  Name: Susan Chaney MRN: [id number] Date of Birth: [birth year]

## 2020-10-19 ENCOUNTER — Encounter: Payer: Medicare Other | Admitting: Physical Therapy

## 2020-10-19 ENCOUNTER — Ambulatory Visit: Payer: Medicare Other | Admitting: Physical Therapy

## 2020-10-21 ENCOUNTER — Encounter: Payer: Medicare Other | Admitting: Physical Therapy

## 2020-10-22 ENCOUNTER — Other Ambulatory Visit: Payer: Self-pay

## 2020-10-22 ENCOUNTER — Encounter: Payer: Self-pay | Admitting: Physical Therapy

## 2020-10-22 ENCOUNTER — Ambulatory Visit: Payer: Medicare Other | Admitting: Physical Therapy

## 2020-10-22 DIAGNOSIS — M25611 Stiffness of right shoulder, not elsewhere classified: Secondary | ICD-10-CM

## 2020-10-22 DIAGNOSIS — M25511 Pain in right shoulder: Secondary | ICD-10-CM

## 2020-10-22 NOTE — Therapy (Signed)
[facility] PH[facility]ORTS MEDICINE [address], [place], [address] Phone: [phone]   Fax:  [phone]  Physical Therapy Treatment  Patient Details  Name: Susan Chaney MRN: [id number] Date of Birth: 10-Jan-1956 No data recorded  Encounter Date: 10/22/2020   PT End of Session - 10/22/20 1045    Visit Number 5    Number of Visits 17    Date for PT Re-Evaluation 11/27/20    PT Start Time 0950    PT Stop Time 1030    PT Time Calculation (min) 40 min    Equipment Utilized During Treatment Other (comment)    Activity Tolerance Patient tolerated treatment well    Behavior During Therapy [facility] for tasks assessed/performed           Past Medical History:  Diagnosis Date  . Acid reflux   . Anxiety   . IBS (irritable bowel syndrome)   . Insomnia   . Osteopenia   . Osteoporosis   . Solitary cyst of breast     Past Surgical History:  Procedure Laterality Date  . BREAST BIOPSY Left 2001   Negative. 2 areas.   . CHOLECYSTECTOMY  03/2009  . COLONOSCOPY  2009   Dr Elliot/ hemorrhoids  . COLPOSCOPY  09/13/2002  . SHOULDER SURGERY  02/2018  . TUBAL LIGATION  10/04/1983    There were no vitals filed for this visit.   Subjective Assessment - 10/22/20 0956    Subjective Pt reports she has some increased soreness and muscle tension near her collarbone following watching her 95 year old granddaughter and helping transfer her elderly parents in/out limo for their anniversary.    Pertinent History Pt is a 64 year old female familar with this clinic presenting with R shoulder ant dislocation 09/12/20 with relocation in ED. Imaging subsequently revealed bankhart lesion, and labrum tear; MD gave patient positive prognosis without surgical intervention. Pt reports aching pain 2/10, that is mostly just with pressure on it sleep on back side of the shoulder and collar bone at the end or the day, when she tries to sleep on it, lifts heavy,  and reaches overhead. 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 Pt denies N/V, B&B changes, unexplained weight fluctuation, saddle paresthesia, fever, night sweats, or unrelenting night pain at this time.    Limitations House hold activities;Reading;Walking    How long can you sit comfortably? unlimited    How long can you stand comfortably? unlimited    How long can you walk comfortably? unlimited    Diagnostic tests Large Hill-Sachs impaction type injury involving the posterior aspect of the humeral head. Bony Bankart lesion also suspected Torn and partially detached anterior labrum (3:00 to 5:00 position). The anterior inferior articular cartilage of the glenoid is also abnormal, likely torn and partially detached.    Patient Stated Goals return to exercise regimen              Full active ROM at beginning of session  Ther-Ex UBE L5 seat 9 L6 for gentle strengthening and ROM UT stretch  Supine pec stretch with towel roll under spine  Scap retraction/band pull aparts RTB 2x 12 with cuing for scapular retraction, some visible winging  HEP update Access Code: [email or web address] Supine Pec Stretch - 3 x daily - 7 x weekly - 60sec hold  Seated Upper Trapezius Stretch - 3 x daily - 7 x weekly - 60 hold Heat 10-52mins 2-3x/day  Manual STM to R pec minor, major at clavicular insertion, and UT. Following: Dry Needling: (2/2) 73mm .25 needles placed along the R UT and pec minor to decrease increased muscular spasms and trigger points with the patient positioned in supine with pincer grasp. Patient was educated on risks and benefits of therapy and verbally consents to PT.    Negative labral cluster Biceps Load II Crank Active Compression Test       PT Education - 10/22/20 1045    Education provided Yes    Education  Details therex form/technique, HEP update, TDN    Person(s) Educated Patient    Methods Explanation;Demonstration;Verbal cues    Comprehension Verbalized understanding;Returned demonstration;Verbal cues required            PT Short Term Goals - 10/01/20 1550      PT SHORT TERM GOAL #1   Title  Pt will be independent with HEP in order to improve strength and ROM to improve function at home and work.    Baseline 10/01/20    Time 4    Period Weeks    Status New             PT Long Term Goals - 10/01/20 1610      PT LONG TERM GOAL #1   Title Patient will increase FOTO score to 73 to demonstrate predicted increase in functional mobility to complete ADLs    Baseline 10/01/20 58    Time 8    Period Days    Status New      PT LONG TERM GOAL #2   Title Pt will decrease worst pain as reported on NPRS to 0/10 in order to demonstrate clinically significant reduction in pain.    Baseline 10/01/20 2/10 pain    Time 6    Period Weeks    Status New      PT LONG TERM GOAL #3   Title Pt will demonstrate R lower trap strength of at least 4+ to demonstrate symmetrical PLOF    Baseline 10/01/20 3+/5    Time 8    Period Weeks    Status New      PT LONG TERM GOAL #5   Status Unable to assess                 Plan - 10/22/20 1047    Clinical Impression Statement PT utilized manual techinques with adjunct TDN to reduce muscle tension of pec major/minor and UT, to reduce pain with success. Patient reports no pain and sensation of less tension following. Patient does have some palpable popping at mid clavicle and some at [facility], suspected pec corcoid and infra/supraspinatus or bicep tendon involvement; in lieu of any positive labrum tests. PT updated HEP with UT and pec major stretch in supine ( to reduce ant humeral head sheer) and suggested heat for maintaining decreased muscle tension with good understanding. Patient maintains some reduced, but still visible scapular winging, that she is  improving with scapular retraction. PT will continue progression as able.    Personal Factors and Comorbidities Age;Comorbidity 1;Comorbidity 2;Sex;Past/Current Experience    Comorbidities osteopenia, osteoporosis    Examination-Activity Limitations Bed Mobility;Reach Overhead;Lift;Carry;Sleep    Examination-Participation Restrictions Psychiatric nurse;Yard Work;Other    Stability/Clinical Decision Making Evolving/Moderate complexity    Clinical Decision Making Moderate    Rehab Potential Good    Clinical Impairments Affecting Rehab  Potential (-) chronicity of sx, mulitple pain areas in L shoulder (+) motivation, active lifestyle, lack of other comorbidities    PT Frequency 2x / week    PT Duration 8 weeks    PT Treatment/Interventions Electrical Stimulation;Ultrasound;Cryotherapy;Moist Heat;Iontophoresis 4mg /ml Dexamethasone;Neuromuscular re-education;Passive range of motion;Manual techniques;Dry needling;Functional mobility training;Therapeutic activities;Patient/family education;Taping;Therapeutic exercise;Joint Manipulations;Spinal Manipulations;Balance training;ADLs/Self Care Home Management    PT Next Visit Plan HEP reivew    PT Home Exercise Plan supine pec stretch, isometrics, scapular retractions    Consulted and Agree with Plan of Care Patient           Patient will benefit from skilled therapeutic intervention in order to improve the following deficits and impairments:  Increased fascial restricitons, Impaired sensation, Improper body mechanics, Pain, Postural dysfunction, Decreased mobility, Decreased endurance, Decreased range of motion, Decreased strength, Impaired UE functional use, Decreased activity tolerance, Decreased coordination, Hypermobility, Impaired flexibility, Impaired tone  Visit Diagnosis: Acute pain of right shoulder  Stiffness of right shoulder, not elsewhere classified     Problem List Patient Active Problem List   Diagnosis Date Noted   . Vaginal atrophy 02/21/2020  . Osteopenia 06/20/2017  . Adult idiopathic generalized osteoporosis 05/07/2015   07/07/2015 DPT Hilda Lias 10/22/2020, 11:45 AM  [facility] PHYSICAL AND SPORTS MEDICINE [address], [address], [place] Phone: [phone]   Fax:  [phone]  Name: JENIKA CHIEM MRN: Shireen Quan Date of Birth: August 31, 1956

## 2020-10-27 ENCOUNTER — Ambulatory Visit: Payer: Medicare Other | Admitting: Physical Therapy

## 2020-10-29 ENCOUNTER — Ambulatory Visit: Payer: Medicare Other | Attending: Orthopedic Surgery | Admitting: Physical Therapy

## 2020-10-29 ENCOUNTER — Encounter: Payer: Self-pay | Admitting: Physical Therapy

## 2020-10-29 ENCOUNTER — Other Ambulatory Visit: Payer: Self-pay

## 2020-10-29 DIAGNOSIS — M25511 Pain in right shoulder: Secondary | ICD-10-CM | POA: Insufficient documentation

## 2020-10-29 DIAGNOSIS — M25611 Stiffness of right shoulder, not elsewhere classified: Secondary | ICD-10-CM | POA: Diagnosis present

## 2020-10-29 NOTE — Therapy (Signed)
[facility]INE [address], [place], [address] Phone: [phone]   Fax:  [phone]  Physical Therapy Treatment  Patient Details  Name: Susan Chaney MRN: [id number] Date of Birth: [birth year] No data recorded  Encounter Date: 10/29/2020   PT End of Session - 10/29/20 1404    Visit Number 6    Number of Visits 17    Date for PT Re-Evaluation 11/27/20    PT Start Time 0145    PT Stop Time 0230    PT Time Calculation (min) 45 min    Equipment Utilized During Treatment Other (comment)    Activity Tolerance Patient tolerated treatment well    Behavior During Therapy [facility] for tasks assessed/performed           Past Medical History:  Diagnosis Date  . Acid reflux   . Anxiety   . IBS (irritable bowel syndrome)   . Insomnia   . Osteopenia   . Osteoporosis   . Solitary cyst of breast     Past Surgical History:  Procedure Laterality Date  . BREAST BIOPSY Left 2001   Negative. 2 areas.   . CHOLECYSTECTOMY  03/2009  . COLONOSCOPY  2009   Dr Elliot/ hemorrhoids  . COLPOSCOPY  09/13/2002  . SHOULDER SURGERY  02/2018  . TUBAL LIGATION  10/04/1983    There were no vitals filed for this visit.   Subjective Assessment - 10/29/20 1349    Subjective Pt reports she felt sore following TDN, but following this (and cortisone injection). No pain and minimal popping since.    Pertinent History Pt is a 64 year old female familar with this clinic presenting with R shoulder ant dislocation 09/12/20 with relocation in ED. Imaging subsequently revealed bankhart lesion, and labrum tear; MD gave patient positive prognosis without surgical intervention. Pt reports aching pain 2/10, that is mostly just with pressure on it sleep on back side of the shoulder and collar bone at the end or the day, when she tries to sleep on it, lifts heavy, and reaches overhead. 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 Pt denies N/V, B&B changes, unexplained weight fluctuation, saddle paresthesia, fever, night sweats, or unrelenting night pain at this time.    Limitations House hold activities;Reading;Walking    How long can you sit comfortably? unlimited    How long can you stand comfortably? unlimited    How long can you walk comfortably? unlimited    Diagnostic tests Large Hill-Sachs impaction type injury involving the posterior aspect of the humeral head. Bony Bankart lesion also suspected Torn and partially detached anterior labrum (3:00 to 5:00 position). The anterior inferior articular cartilage of the glenoid is also abnormal, likely torn and partially detached.    Patient Stated Goals return to exercise regimen    Pain Onset In the past 7 days           Ther-Ex UBE L5 seat 9 L6 for gentle strengthening and ROM FWD BWD Supine serratus punch 6# 3x 10 with excellent carry over of scapular mobility  Supine 6# hold 90d flex with scap retraction with random mod perturbations 2x 30sec  Lat pull down 20# 2x 10 with good carry over, min cuing for technique initially Seated row 20# x10; 25# x10 with min cuing for scapular retraction initially  with good carry over OMEGA chest press 20# 2x 10 with min cuing for eccentric control with good carry over Small body blade perturbation at 90d flex 2x 15sec with TC for starting position with good carry over                PT Education - 10/29/20 1401    Education provided Yes    Education Details therex form/technique    Person(s) Educated Patient    Methods Explanation;Demonstration;Verbal cues    Comprehension Verbalized understanding;Returned demonstration;Verbal cues required            PT Short Term Goals - 10/01/20 1550      PT SHORT TERM GOAL #1   Title  Pt will be  independent with HEP in order to improve strength and ROM to improve function at home and work.    Baseline 10/01/20    Time 4    Period Weeks    Status New             PT Long Term Goals - 10/01/20 1610      PT LONG TERM GOAL #1   Title Patient will increase FOTO score to 73 to demonstrate predicted increase in functional mobility to complete ADLs    Baseline 10/01/20 58    Time 8    Period Days    Status New      PT LONG TERM GOAL #2   Title Pt will decrease worst pain as reported on NPRS to 0/10 in order to demonstrate clinically significant reduction in pain.    Baseline 10/01/20 2/10 pain    Time 6    Period Weeks    Status New      PT LONG TERM GOAL #3   Title Pt will demonstrate R lower trap strength of at least 4+ to demonstrate symmetrical PLOF    Baseline 10/01/20 3+/5    Time 8    Period Weeks    Status New      PT LONG TERM GOAL #5   Status Unable to assess                 Plan - 10/29/20 1415    Clinical Impression Statement PT continued therex progression for R shoulder/scapular stability and scapulohumeral rhythm with success. Pt inquired about what therex she can add back into her lifting regimen with personal trainer, PT led patient through therex that is appropriate at gym at this time with patient able to demonstrate good carry over and understanding of importance of eccentric control, pulling> pushing therex. Patietn is able to compolete all therex with proper technqiue following cuing and demo. PT will continue progression as able.    Personal Factors and Comorbidities Age;Comorbidity 1;Comorbidity 2;Sex;Past/Current Experience    Comorbidities osteopenia, osteoporosis    Examination-Activity Limitations Bed Mobility;Reach Overhead;Lift;Carry;Sleep    Examination-Participation Restrictions Psychiatric nurse;Yard Work;Other    Stability/Clinical Decision Making Evolving/Moderate complexity    Clinical Decision Making Moderate     Rehab Potential Good    Clinical Impairments Affecting Rehab Potential (-) chronicity of sx, mulitple pain areas in L shoulder (+) motivation, active lifestyle, lack of other comorbidities    PT Frequency 2x / week    PT Duration 8 weeks    PT Treatment/Interventions Electrical Stimulation;Ultrasound;Cryotherapy;Moist Heat;Iontophoresis 4mg /ml Dexamethasone;Neuromuscular re-education;Passive range of motion;Manual techniques;Dry needling;Functional mobility training;Therapeutic activities;Patient/family education;Taping;Therapeutic exercise;Joint Manipulations;Spinal Manipulations;Balance training;ADLs/Self Care Home Management    PT Next Visit Plan HEP reivew    PT Home Exercise Plan  supine pec stretch, isometrics, scapular retractions    Consulted and Agree with Plan of Care Patient           Patient will benefit from skilled therapeutic intervention in order to improve the following deficits and impairments:  Increased fascial restricitons, Impaired sensation, Improper body mechanics, Pain, Postural dysfunction, Decreased mobility, Decreased endurance, Decreased range of motion, Decreased strength, Impaired UE functional use, Decreased activity tolerance, Decreased coordination, Hypermobility, Impaired flexibility, Impaired tone  Visit Diagnosis: Acute pain of right shoulder  Stiffness of right shoulder, not elsewhere classified     Problem List Patient Active Problem List   Diagnosis Date Noted  . Vaginal atrophy 02/21/2020  . Osteopenia 06/20/2017  . Adult idiopathic generalized osteoporosis 05/07/2015   Hilda Lias DPT Hilda Lias 10/29/2020, 2:29 PM  [facility] PHYSICAL AND SPORTS MEDICINE [address] [place], [address] Phone: [phone]   Fax:  [phone]  Name: LOVEY CRUPI MRN: [id number] Date of Birth: 05/14/1956

## 2020-11-03 ENCOUNTER — Other Ambulatory Visit: Payer: Self-pay

## 2020-11-03 ENCOUNTER — Encounter: Payer: Self-pay | Admitting: Physical Therapy

## 2020-11-03 ENCOUNTER — Ambulatory Visit: Payer: Medicare Other | Admitting: Physical Therapy

## 2020-11-03 DIAGNOSIS — M25511 Pain in right shoulder: Secondary | ICD-10-CM | POA: Diagnosis not present

## 2020-11-03 DIAGNOSIS — M25611 Stiffness of right shoulder, not elsewhere classified: Secondary | ICD-10-CM

## 2020-11-03 NOTE — Therapy (Signed)
[facility] PHYS[facility]TS MEDICINE [address], [place], [address] Phone: [phone]   Fax:  [phone]  Physical Therapy Treatment  Patient Details  Name: Susan Chaney MRN: [id number] Date of Birth: 13-May-1956 No data recorded  Encounter Date: 11/03/2020   PT End of Session - 11/03/20 1445    Visit Number 7    Number of Visits 17    Date for PT Re-Evaluation 11/27/20    PT Start Time 1115    PT Stop Time 1200    PT Time Calculation (min) 45 min    Equipment Utilized During Treatment Other (comment)    Activity Tolerance Patient tolerated treatment well    Behavior During Therapy [facility] for tasks assessed/performed           Past Medical History:  Diagnosis Date  . Acid reflux   . Anxiety   . IBS (irritable bowel syndrome)   . Insomnia   . Osteopenia   . Osteoporosis   . Solitary cyst of breast     Past Surgical History:  Procedure Laterality Date  . BREAST BIOPSY Left 2001   Negative. 2 areas.   . CHOLECYSTECTOMY  03/2009  . COLONOSCOPY  2009   Dr Elliot/ hemorrhoids  . COLPOSCOPY  09/13/2002  . SHOULDER SURGERY  02/2018  . TUBAL LIGATION  10/04/1983    There were no vitals filed for this visit.   Subjective Assessment - 11/03/20 1123    Subjective Pt is continuing to have some "popping" at stenoclavicular joint, but overall doing well. Completing HEP, without pain.    Pertinent History Pt is a 64 year old female familar with this clinic presenting with R shoulder ant dislocation 09/12/20 with relocation in ED. Imaging subsequently revealed bankhart lesion, and labrum tear; MD gave patient positive prognosis without surgical intervention. Pt reports aching pain 2/10, that is mostly just with pressure on it sleep on back side of the shoulder and collar bone at the end or the day, when she tries to sleep on it, lifts heavy, and reaches overhead. 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 Pt denies N/V, B&B changes, unexplained weight fluctuation, saddle paresthesia, fever, night sweats, or unrelenting night pain at this time.    Limitations House hold activities;Reading;Walking    How long can you sit comfortably? unlimited    How long can you stand comfortably? unlimited    How long can you walk comfortably? unlimited    Diagnostic tests Large Hill-Sachs impaction type injury involving the posterior aspect of the humeral head. Bony Bankart lesion also suspected Torn and partially detached anterior labrum (3:00 to 5:00 position). The anterior inferior articular cartilage of the glenoid is also abnormal, likely torn and partially detached.    Patient Stated Goals return to exercise regimen    Pain Onset In the past 7 days               Ther-Ex UBE L5seat 9L6 for gentle strengthening and ROM FWD BWD Modified push up on mat 2x 10 with good carry over of cuing for technique Bosu ball clockwise/counter on hardside in modified push up position x12 each direction Ball throw toss with PT throwing ball slightly outside BOS x12 with good reaching; at rebounded x12 6.6# D2 pattern in standing with GTB 2x 12 with therapist  resistance superior/lateral to patient with resistance for eccentric control as well Military press 5# DB 3x10 with good carry over of cuing for proper technique     PT Education - 11/03/20 1445    Education provided Yes    Education Details therex form/technique    Person(s) Educated Patient    Methods Explanation;Demonstration;Verbal cues    Comprehension Verbalized understanding;Returned demonstration;Verbal cues required            PT Short Term Goals - 10/01/20 1550      PT SHORT TERM GOAL #1   Title  Pt will be independent with HEP in order to improve strength and ROM to improve function at  home and work.    Baseline 10/01/20    Time 4    Period Weeks    Status New             PT Long Term Goals - 10/01/20 1610      PT LONG TERM GOAL #1   Title Patient will increase FOTO score to 73 to demonstrate predicted increase in functional mobility to complete ADLs    Baseline 10/01/20 58    Time 8    Period Days    Status New      PT LONG TERM GOAL #2   Title Pt will decrease worst pain as reported on NPRS to 0/10 in order to demonstrate clinically significant reduction in pain.    Baseline 10/01/20 2/10 pain    Time 6    Period Weeks    Status New      PT LONG TERM GOAL #3   Title Pt will demonstrate R lower trap strength of at least 4+ to demonstrate symmetrical PLOF    Baseline 10/01/20 3+/5    Time 8    Period Weeks    Status New      PT LONG TERM GOAL #5   Status Unable to assess                 Plan - 11/03/20 1445    Clinical Impression Statement PT continued therex progression for scapular/RTC strengthening and motor control with progression of plyometrics and compound movements with success. Patient is able to comply with all cuing for proper technique of therex with good motivation and no increased pain throughout session. PT will continue progressionas able.    Personal Factors and Comorbidities Age;Comorbidity 1;Comorbidity 2;Sex;Past/Current Experience    Comorbidities osteopenia, osteoporosis    Examination-Activity Limitations Bed Mobility;Reach Overhead;Lift;Carry;Sleep    Examination-Participation Restrictions Psychiatric nurse;Yard Work;Other    Stability/Clinical Decision Making Evolving/Moderate complexity    Clinical Decision Making Moderate    Rehab Potential Good    Clinical Impairments Affecting Rehab Potential (-) chronicity of sx, mulitple pain areas in L shoulder (+) motivation, active lifestyle, lack of other comorbidities    PT Frequency 2x / week    PT Duration 8 weeks    PT Treatment/Interventions Electrical  Stimulation;Ultrasound;Cryotherapy;Moist Heat;Iontophoresis 4mg /ml Dexamethasone;Neuromuscular re-education;Passive range of motion;Manual techniques;Dry needling;Functional mobility training;Therapeutic activities;Patient/family education;Taping;Therapeutic exercise;Joint Manipulations;Spinal Manipulations;Balance training;ADLs/Self Care Home Management    PT Next Visit Plan HEP reivew    PT Home Exercise Plan supine pec stretch, isometrics, scapular retractions    Consulted and Agree with Plan of Care Patient           Patient will benefit from skilled therapeutic intervention in order to improve the following deficits and impairments:  Increased fascial restricitons, Impaired sensation, Improper body mechanics, Pain, Postural dysfunction, Decreased mobility, Decreased  endurance, Decreased range of motion, Decreased strength, Impaired UE functional use, Decreased activity tolerance, Decreased coordination, Hypermobility, Impaired flexibility, Impaired tone  Visit Diagnosis: Acute pain of right shoulder  Stiffness of right shoulder, not elsewhere classified     Problem List Patient Active Problem List   Diagnosis Date Noted  . Vaginal atrophy 02/21/2020  . Osteopenia 06/20/2017  . Adult idiopathic generalized osteoporosis 05/07/2015    Hilda Lias 11/03/2020, 2:50 PM  [place] [facility] PHYSICAL AND SPORTS MEDICINE [address], [place], [address] Phone: [phone]   Fax:  [phone]  Name: Susan Chaney MRN: [id number] Date of Birth: May 03, 1956

## 2020-11-05 ENCOUNTER — Ambulatory Visit: Payer: Medicare Other | Admitting: Physical Therapy

## 2020-11-05 ENCOUNTER — Encounter: Payer: Self-pay | Admitting: Physical Therapy

## 2020-11-05 DIAGNOSIS — M25611 Stiffness of right shoulder, not elsewhere classified: Secondary | ICD-10-CM

## 2020-11-05 DIAGNOSIS — M25511 Pain in right shoulder: Secondary | ICD-10-CM

## 2020-11-05 NOTE — Therapy (Signed)
[place] [facility] PH[facility]TS MEDICINE [address] [place], [address] Phone: [phone]   Fax:  [phone]  Physical Therapy Treatment  Patient Details  Name: Susan Chaney MRN: [id number] Date of Birth: 10/08/1956 No data recorded  Encounter Date: 11/05/2020   PT End of Session - 11/05/20 1138    Visit Number 8    Number of Visits 17    Date for PT Re-Evaluation 11/27/20    PT Start Time 1115    PT Stop Time 1155    PT Time Calculation (min) 40 min    Equipment Utilized During Treatment Other (comment)    Activity Tolerance Patient tolerated treatment well    Behavior During Therapy [facility] for tasks assessed/performed           Past Medical History:  Diagnosis Date  . Acid reflux   . Anxiety   . IBS (irritable bowel syndrome)   . Insomnia   . Osteopenia   . Osteoporosis   . Solitary cyst of breast     Past Surgical History:  Procedure Laterality Date  . BREAST BIOPSY Left 2001   Negative. 2 areas.   . CHOLECYSTECTOMY  03/2009  . COLONOSCOPY  2009   Dr Elliot/ hemorrhoids  . COLPOSCOPY  09/13/2002  . SHOULDER SURGERY  02/2018  . TUBAL LIGATION  10/04/1983    There were no vitals filed for this visit.   Subjective Assessment - 11/05/20 1120    Subjective Pt reports shoulder is feeling "good overall", some LBP following cleaning her baseboard    Pertinent History Pt is a 64 year old female familar with this clinic presenting with R shoulder ant dislocation 09/12/20 with relocation in ED. Imaging subsequently revealed bankhart lesion, and labrum tear; MD gave patient positive prognosis without surgical intervention. Pt reports aching pain 2/10, that is mostly just with pressure on it sleep on back side of the shoulder and collar bone at the end or the day, when she tries to sleep on it, lifts heavy, and reaches overhead. 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 Pt denies N/V, B&B changes, unexplained weight fluctuation, saddle paresthesia, fever, night sweats, or unrelenting night pain at this time.    Limitations House hold activities;Reading;Walking    How long can you sit comfortably? unlimited    How long can you stand comfortably? unlimited    How long can you walk comfortably? unlimited    Diagnostic tests Large Hill-Sachs impaction type injury involving the posterior aspect of the humeral head. Bony Bankart lesion also suspected Torn and partially detached anterior labrum (3:00 to 5:00 position). The anterior inferior articular cartilage of the glenoid is also abnormal, likely torn and partially detached.    Patient Stated Goals return to exercise regimen    Pain Onset In the past 7 days             Ther-Ex Nustep UE 9 L1 L3 Figure 8 swings 1kg in pillow 3x 30sec with good carry over of demo Pushup with small hop on wall 3x 6 with good carry over of technique Modified push up on mat 2x 10 with good carry over of cuing for technique Ball throw toss at reboundeder 3 x12 6.6# with no increased pain  D2 pattern in standing with GTB 2x 12 with therapist resistance superior/lateral to  patient with resistance for eccentric control as well with good carry over of technique Military press 7# DB 3x 8 with good carry over of technique from last session     PT Education - 11/05/20 1133    Education provided Yes    Education Details therex form/technique    Person(s) Educated Patient    Methods Explanation;Demonstration;Verbal cues    Comprehension Verbalized understanding;Returned demonstration;Verbal cues required            PT Short Term Goals - 10/01/20 1550      PT SHORT TERM GOAL #1   Title  Pt will be independent with HEP in order to improve strength and ROM to improve function at home and work.    Baseline  10/01/20    Time 4    Period Weeks    Status New             PT Long Term Goals - 10/01/20 1610      PT LONG TERM GOAL #1   Title Patient will increase FOTO score to 73 to demonstrate predicted increase in functional mobility to complete ADLs    Baseline 10/01/20 58    Time 8    Period Days    Status New      PT LONG TERM GOAL #2   Title Pt will decrease worst pain as reported on NPRS to 0/10 in order to demonstrate clinically significant reduction in pain.    Baseline 10/01/20 2/10 pain    Time 6    Period Weeks    Status New      PT LONG TERM GOAL #3   Title Pt will demonstrate R lower trap strength of at least 4+ to demonstrate symmetrical PLOF    Baseline 10/01/20 3+/5    Time 8    Period Weeks    Status New      PT LONG TERM GOAL #5   Status Unable to assess                 Plan - 11/05/20 1150    Clinical Impression Statement PT continued therex progression for strenghtening, with continued progression of plyometric and power training. Pt is able to complete all therex with proper technique following demonstrations and cuing with good motivation and no increased pain throughout session. PT will continue progression for return to tennis as able.    Personal Factors and Comorbidities Age;Comorbidity 1;Comorbidity 2;Sex;Past/Current Experience    Comorbidities osteopenia, osteoporosis    Examination-Activity Limitations Bed Mobility;Reach Overhead;Lift;Carry;Sleep    Examination-Participation Restrictions Psychiatric nurse;Yard Work;Other    Stability/Clinical Decision Making Evolving/Moderate complexity    Clinical Decision Making Moderate    Rehab Potential Good    Clinical Impairments Affecting Rehab Potential (-) chronicity of sx, mulitple pain areas in L shoulder (+) motivation, active lifestyle, lack of other comorbidities    PT Frequency 2x / week    PT Duration 8 weeks    PT Treatment/Interventions Electrical  Stimulation;Ultrasound;Cryotherapy;Moist Heat;Iontophoresis 4mg /ml Dexamethasone;Neuromuscular re-education;Passive range of motion;Manual techniques;Dry needling;Functional mobility training;Therapeutic activities;Patient/family education;Taping;Therapeutic exercise;Joint Manipulations;Spinal Manipulations;Balance training;ADLs/Self Care Home Management    PT Next Visit Plan HEP reivew    PT Home Exercise Plan supine pec stretch, isometrics, scapular retractions    Consulted and Agree with Plan of Care Patient           Patient will benefit from skilled therapeutic intervention in order to improve the following deficits and impairments:  Increased fascial restricitons, Impaired sensation, Improper body mechanics, Pain,  Postural dysfunction, Decreased mobility, Decreased endurance, Decreased range of motion, Decreased strength, Impaired UE functional use, Decreased activity tolerance, Decreased coordination, Hypermobility, Impaired flexibility, Impaired tone  Visit Diagnosis: Acute pain of right shoulder  Stiffness of right shoulder, not elsewhere classified     Problem List Patient Active Problem List   Diagnosis Date Noted  . Vaginal atrophy 02/21/2020  . Osteopenia 06/20/2017  . Adult idiopathic generalized osteoporosis 05/07/2015   Hilda Lias DPT Hilda Lias 11/05/2020, 11:58 AM  [facility] PHYSICAL AND SPORTS MEDICINE [address], [place], [address] Phone: [phone]   Fax:  [phone]  Name: DARLINA MCCAUGHEY MRN: [id number] Date of Birth: 1956-04-06

## 2020-11-09 ENCOUNTER — Other Ambulatory Visit: Payer: Self-pay

## 2020-11-09 ENCOUNTER — Ambulatory Visit
Admission: RE | Admit: 2020-11-09 | Discharge: 2020-11-09 | Disposition: A | Discharge status: Home / Self Care | Payer: Medicare Other | Source: Ambulatory Visit | Attending: Internal Medicine | Admitting: Internal Medicine

## 2020-11-09 DIAGNOSIS — Z1231 Encounter for screening mammogram for malignant neoplasm of breast: Secondary | ICD-10-CM | POA: Diagnosis present

## 2020-11-10 ENCOUNTER — Encounter: Payer: Self-pay | Admitting: Physical Therapy

## 2020-11-10 ENCOUNTER — Ambulatory Visit: Payer: Medicare Other | Admitting: Physical Therapy

## 2020-11-10 DIAGNOSIS — M25611 Stiffness of right shoulder, not elsewhere classified: Secondary | ICD-10-CM

## 2020-11-10 DIAGNOSIS — M25511 Pain in right shoulder: Secondary | ICD-10-CM | POA: Diagnosis not present

## 2020-11-10 NOTE — Therapy (Signed)
[facility] [address], [place], [address] Phone: [phone]   Fax:  [phone]  Physical Therapy Treatment  Patient Details  Name: Susan Chaney MRN: [id number] Date of Birth: July 31, 1956 No data recorded  Encounter Date: 11/10/2020   PT End of Session - 11/10/20 1151    Visit Number 9    Number of Visits 17    Date for PT Re-Evaluation 11/27/20    PT Start Time 1115    PT Stop Time 1155    PT Time Calculation (min) 40 min    Equipment Utilized During Treatment Other (comment)    Activity Tolerance Patient tolerated treatment well    Behavior During Therapy [facility] for tasks assessed/performed           Past Medical History:  Diagnosis Date  . Acid reflux   . Anxiety   . IBS (irritable bowel syndrome)   . Insomnia   . Osteopenia   . Osteoporosis   . Solitary cyst of breast     Past Surgical History:  Procedure Laterality Date  . BREAST BIOPSY Left 2001   Negative. 2 areas.   . CHOLECYSTECTOMY  03/2009  . COLONOSCOPY  2009   Dr Elliot/ hemorrhoids  . COLPOSCOPY  09/13/2002  . SHOULDER SURGERY  02/2018  . TUBAL LIGATION  10/04/1983    There were no vitals filed for this visit.   Subjective Assessment - 11/10/20 1129    Subjective Continuing to feel good overall. Continues to have some sternoclavicular "popping" and pain/tension at pec major near sternaml insertion but it is more tolearble. Minimal sensations of shoulder popping, 1 instance of shoulder popping when pushing to get out of bed, not painful    Pertinent History Pt is a 64 year old female familar with this clinic presenting with R shoulder ant dislocation 09/12/20 with relocation in ED. Imaging subsequently revealed bankhart lesion, and labrum tear; MD gave patient positive prognosis without surgical intervention. Pt reports aching pain 2/10, that is mostly just with pressure on it sleep on back side of the shoulder and collar  bone at the end or the day, when she tries to sleep on it, lifts heavy, and reaches overhead. 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 Pt denies N/V, B&B changes, unexplained weight fluctuation, saddle paresthesia, fever, night sweats, or unrelenting night pain at this time.    Limitations House hold activities;Reading;Walking    How long can you sit comfortably? unlimited    How long can you stand comfortably? unlimited    How long can you walk comfortably? unlimited    Diagnostic tests Large Hill-Sachs impaction type injury involving the posterior aspect of the humeral head. Bony Bankart lesion also suspected Torn and partially detached anterior labrum (3:00 to 5:00 position). The anterior inferior articular cartilage of the glenoid is also abnormal, likely torn and partially detached.    Patient Stated Goals return to exercise regimen    Pain Onset In the past 7 days             Ther-Ex Nustep UE 9 seat 6 UE only L4 for increased protraction/retraction strength  Figure 8 swings 2kg in pillow 3x 30sec with good carry over of demo Pushup with small hop on elevated mat 3x 6 with good carry over of technique Supine chest press  7# DB bilat with min cuing for initial technique with good carry over Small body blade perturbations (vertical) in scaption + 90d flex 20sec; horizontal with movement into flexion x20sec; vertical with overhead scaption x20sec Ball throw toss (chest press) at reboundeder 3 x12 6.6# with no increased pain                 PT Education - 11/10/20 1141    Education provided Yes    Education Details therex form/technique    Person(s) Educated Patient    Methods Explanation;Demonstration;Verbal cues    Comprehension Verbalized understanding;Returned demonstration;Verbal cues required             PT Short Term Goals - 10/01/20 1550      PT SHORT TERM GOAL #1   Title  Pt will be independent with HEP in order to improve strength and ROM to improve function at home and work.    Baseline 10/01/20    Time 4    Period Weeks    Status New             PT Long Term Goals - 10/01/20 1610      PT LONG TERM GOAL #1   Title Patient will increase FOTO score to 73 to demonstrate predicted increase in functional mobility to complete ADLs    Baseline 10/01/20 58    Time 8    Period Days    Status New      PT LONG TERM GOAL #2   Title Pt will decrease worst pain as reported on NPRS to 0/10 in order to demonstrate clinically significant reduction in pain.    Baseline 10/01/20 2/10 pain    Time 6    Period Weeks    Status New      PT LONG TERM GOAL #3   Title Pt will demonstrate R lower trap strength of at least 4+ to demonstrate symmetrical PLOF    Baseline 10/01/20 3+/5    Time 8    Period Weeks    Status New      PT LONG TERM GOAL #5   Status Unable to assess                 Plan - 11/10/20 1152    Clinical Impression Statement PT continued therex progression for strengthening/stabilization and plyometric power with success. Patinet is abe demonstrate good carry over all therex following cuing with good carry over. Patient with noted trigger points at pec major along clavicular insertion, educated on strengthening needed and continued stretching for this with good understanding. PT will continue progression as able.    Personal Factors and Comorbidities Age;Comorbidity 1;Comorbidity 2;Sex;Past/Current Experience    Comorbidities osteopenia, osteoporosis    Examination-Activity Limitations Bed Mobility;Reach Overhead;Lift;Carry;Sleep    Examination-Participation Restrictions Psychiatric nurse;Yard Work;Other    Stability/Clinical Decision Making Evolving/Moderate complexity    Clinical Decision Making Moderate    Rehab Potential Good    Clinical  Impairments Affecting Rehab Potential (-) chronicity of sx, mulitple pain areas in L shoulder (+) motivation, active lifestyle, lack of other comorbidities    PT Frequency 2x / week    PT Duration 8 weeks    PT Treatment/Interventions Electrical Stimulation;Ultrasound;Cryotherapy;Moist Heat;Iontophoresis 4mg /ml Dexamethasone;Neuromuscular re-education;Passive range of motion;Manual techniques;Dry needling;Functional mobility training;Therapeutic activities;Patient/family education;Taping;Therapeutic exercise;Joint Manipulations;Spinal Manipulations;Balance training;ADLs/Self Care Home Management    PT Next Visit Plan HEP reivew    PT Home Exercise Plan supine pec stretch, isometrics, scapular retractions    Consulted and Agree with  Plan of Care Patient           Patient will benefit from skilled therapeutic intervention in order to improve the following deficits and impairments:  Increased fascial restricitons, Impaired sensation, Improper body mechanics, Pain, Postural dysfunction, Decreased mobility, Decreased endurance, Decreased range of motion, Decreased strength, Impaired UE functional use, Decreased activity tolerance, Decreased coordination, Hypermobility, Impaired flexibility, Impaired tone  Visit Diagnosis: Acute pain of right shoulder  Stiffness of right shoulder, not elsewhere classified     Problem List Patient Active Problem List   Diagnosis Date Noted  . Vaginal atrophy 02/21/2020  . Osteopenia 06/20/2017  . Adult idiopathic generalized osteoporosis 05/07/2015   Hilda Lias DPT Hilda Lias 11/10/2020, 12:02 PM  [facility] MEDIC[facility] [address] [place], [address] Phone: [phone]   Fax:  [phone]  Name: Susan Chaney MRN: [id number] Date of Birth: Nov 01, 1956

## 2020-11-12 ENCOUNTER — Ambulatory Visit: Payer: Medicare Other | Admitting: Physical Therapy

## 2020-11-16 ENCOUNTER — Other Ambulatory Visit: Payer: Self-pay | Admitting: Internal Medicine

## 2020-11-16 DIAGNOSIS — R928 Other abnormal and inconclusive findings on diagnostic imaging of breast: Secondary | ICD-10-CM

## 2020-11-16 DIAGNOSIS — N6489 Other specified disorders of breast: Secondary | ICD-10-CM

## 2020-11-17 ENCOUNTER — Ambulatory Visit: Payer: Medicare Other | Admitting: Physical Therapy

## 2020-11-23 ENCOUNTER — Ambulatory Visit
Admission: RE | Admit: 2020-11-23 | Discharge: 2020-11-23 | Disposition: A | Discharge status: Home / Self Care | Payer: Medicare Other | Source: Ambulatory Visit | Attending: Internal Medicine | Admitting: Internal Medicine

## 2020-11-23 ENCOUNTER — Ambulatory Visit: Payer: Medicare Other | Admitting: Physical Therapy

## 2020-11-23 ENCOUNTER — Encounter: Payer: Self-pay | Admitting: Physical Therapy

## 2020-11-23 ENCOUNTER — Other Ambulatory Visit: Payer: Self-pay

## 2020-11-23 DIAGNOSIS — M25611 Stiffness of right shoulder, not elsewhere classified: Secondary | ICD-10-CM

## 2020-11-23 DIAGNOSIS — M25511 Pain in right shoulder: Secondary | ICD-10-CM | POA: Diagnosis not present

## 2020-11-23 DIAGNOSIS — N6489 Other specified disorders of breast: Secondary | ICD-10-CM

## 2020-11-23 DIAGNOSIS — R928 Other abnormal and inconclusive findings on diagnostic imaging of breast: Secondary | ICD-10-CM | POA: Diagnosis present

## 2020-11-23 NOTE — Therapy (Signed)
[facility] [address], [place], [address] Phone: [phone]   Fax:  [phone]  Physical Therapy Treatment  Patient Details  Name: Susan Chaney MRN: [id number] Date of Birth: 12-19-1956 No data recorded  Encounter Date: 11/23/2020   PT End of Session - 11/23/20 0909    Visit Number 10    Number of Visits 17    Date for PT Re-Evaluation 11/27/20    PT Start Time 0903    PT Stop Time 0942    PT Time Calculation (min) 39 min    Activity Tolerance Patient tolerated treatment well    Behavior During Therapy [facility] for tasks assessed/performed           Past Medical History:  Diagnosis Date  . Acid reflux   . Anxiety   . IBS (irritable bowel syndrome)   . Insomnia   . Osteopenia   . Osteoporosis   . Solitary cyst of breast     Past Surgical History:  Procedure Laterality Date  . BREAST BIOPSY Left 2001   Negative. 2 areas.   . CHOLECYSTECTOMY  03/2009  . COLONOSCOPY  2009   Dr Elliot/ hemorrhoids  . COLPOSCOPY  09/13/2002  . SHOULDER SURGERY  02/2018  . TUBAL LIGATION  10/04/1983    There were no vitals filed for this visit.   Subjective Assessment - 11/23/20 0907    Subjective Continuing to feel good overall, reports an instance of a "pop" at her collarbone when hyyperext shoulder to reach under a bed (sideways in abd position). Reports no pain today.    Pertinent History Pt is a 64 year old female familar with this clinic presenting with R shoulder ant dislocation 09/12/20 with relocation in ED. Imaging subsequently revealed bankhart lesion, and labrum tear; MD gave patient positive prognosis without surgical intervention. Pt reports aching pain 2/10, that is mostly just with pressure on it sleep on back side of the shoulder and collar bone at the end or the day, when she tries to sleep on it, lifts heavy, and reaches overhead. 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 Pt denies N/V, B&B changes, unexplained weight fluctuation, saddle paresthesia, fever, night sweats, or unrelenting night pain at this time.    Limitations House hold activities;Reading;Walking    How long can you sit comfortably? unlimited    How long can you stand comfortably? unlimited    How long can you walk comfortably? unlimited    Diagnostic tests Large Hill-Sachs impaction type injury involving the posterior aspect of the humeral head. Bony Bankart lesion also suspected Torn and partially detached anterior labrum (3:00 to 5:00 position). The anterior inferior articular cartilage of the glenoid is also abnormal, likely torn and partially detached.    Patient Stated Goals return to exercise regimen    Pain Onset In the past 7 days           MMT assessment   Ther-Ex Nustep UE 9 seat 6 UE only L4 32mins for increased protraction/retraction strength  Prone Y 3x 10 with min cuing for initial technique with good carry over Bent over RUE row 10# DB 3x 10 with cuing initially for scapular retraction with good carry over Seated fly horizontal abd<>add 2# 2x 10; attempted withtheraband with popping sensation noted at ant GHJ, most likely proximal bicep  tendon  Small body blade perturbations (vertical) in scaption + 90d flex 2x 20sec;     Manual STM with trigger pointing to pec major near proximal clavicular insertion, and pec major                        PT Education - 11/23/20 0908    Education provided Yes    Education Details therex form/technique    Person(s) Educated Patient    Methods Explanation;Demonstration;Verbal cues    Comprehension Verbalized understanding;Returned demonstration;Verbal cues required            PT Short Term Goals - 10/01/20 1550      PT SHORT TERM GOAL #1   Title  Pt will be  independent with HEP in order to improve strength and ROM to improve function at home and work.    Baseline 10/01/20    Time 4    Period Weeks    Status New             PT Long Term Goals - 11/23/20 0909      PT LONG TERM GOAL #1   Title Patient will increase FOTO score to 73 to demonstrate predicted increase in functional mobility to complete ADLs    Baseline 10/01/20 58; 11/23/20 80    Time 8    Period Days    Status Achieved      PT LONG TERM GOAL #2   Title Pt will decrease worst pain as reported on NPRS to 0/10 in order to demonstrate clinically significant reduction in pain.    Baseline 10/01/20 2/10 pain; 11/23/20 Generally no pain, 2/10 in the mornings that quickly resolves    Time 6    Period Weeks    Status Partially Met      PT LONG TERM GOAL #3   Title Pt will demonstrate R lower trap strength of at least 4+ to demonstrate symmetrical PLOF    Baseline 10/01/20 3+/5; 11/29/214-/5    Time 8    Period Weeks    Status On-going      PT LONG TERM GOAL #4   Period Weeks                 Plan - 11/23/20 1309    Clinical Impression Statement PT continued therex progression for increased scapulohumeral rhythm and stability with success. PT reassessed patient goals this session wehre patient is making strong progress toward goals, with remaining deficits, in pain and scapular retraction strength. Patient with some painless popping this session with add/abduction movements, seems to be muscular as opposed to intracapsular. PT will continue progression as able.    Personal Factors and Comorbidities Age;Comorbidity 1;Comorbidity 2;Sex;Past/Current Experience    Comorbidities osteopenia, osteoporosis    Examination-Activity Limitations Bed Mobility;Reach Overhead;Lift;Carry;Sleep    Examination-Participation Restrictions Estate agent;Yard Work;Other    Stability/Clinical Decision Making Evolving/Moderate complexity    Clinical Decision Making  Moderate    Rehab Potential Good    Clinical Impairments Affecting Rehab Potential (-) chronicity of sx, mulitple pain areas in L shoulder (+) motivation, active lifestyle, lack of other comorbidities    PT Frequency 2x / week    PT Duration 8 weeks    PT Treatment/Interventions Electrical Stimulation;Ultrasound;Cryotherapy;Moist Heat;Iontophoresis 4mg /ml Dexamethasone;Neuromuscular re-education;Passive range of motion;Manual techniques;Dry needling;Functional mobility training;Therapeutic activities;Patient/family education;Taping;Therapeutic exercise;Joint Manipulations;Spinal Manipulations;Balance training;ADLs/Self Care Home Management    PT Next Visit Plan continue POC    PT Home Exercise Plan supine pec stretch, isometrics, scapular  retractions    Consulted and Agree with Plan of Care Patient           Patient will benefit from skilled therapeutic intervention in order to improve the following deficits and impairments:  Increased fascial restricitons, Impaired sensation, Improper body mechanics, Pain, Postural dysfunction, Decreased mobility, Decreased endurance, Decreased range of motion, Decreased strength, Impaired UE functional use, Decreased activity tolerance, Decreased coordination, Hypermobility, Impaired flexibility, Impaired tone  Visit Diagnosis: Acute pain of right shoulder  Stiffness of right shoulder, not elsewhere classified     Problem List Patient Active Problem List   Diagnosis Date Noted  . Vaginal atrophy 02/21/2020  . Osteopenia 06/20/2017  . Adult idiopathic generalized osteoporosis 05/07/2015   Durwin Reges DPT Durwin Reges 11/23/2020, 1:17 PM  [facility] [facility] [address] [place], [address] Phone: [phone]   Fax:  [phone]  Name: Susan Chaney MRN: [id number] Date of Birth: 10-03-1956

## 2020-11-25 ENCOUNTER — Encounter: Payer: Self-pay | Admitting: Physical Therapy

## 2020-11-25 ENCOUNTER — Ambulatory Visit: Payer: Medicare Other | Attending: Orthopedic Surgery | Admitting: Physical Therapy

## 2020-11-25 ENCOUNTER — Other Ambulatory Visit: Payer: Self-pay

## 2020-11-25 DIAGNOSIS — M25511 Pain in right shoulder: Secondary | ICD-10-CM | POA: Insufficient documentation

## 2020-11-25 DIAGNOSIS — M25611 Stiffness of right shoulder, not elsewhere classified: Secondary | ICD-10-CM | POA: Insufficient documentation

## 2020-11-25 NOTE — Therapy (Signed)
[facility] [address] [place], [address] Phone: [phone]   Fax:  [phone]  Physical Therapy Treatment  Patient Details  Name: Susan Chaney MRN: [id number] Date of Birth: 10-26-1956 No data recorded  Encounter Date: 11/25/2020   PT End of Session - 11/25/20 1320    Visit Number 11    Number of Visits 17    Date for PT Re-Evaluation 11/27/20    PT Start Time 0103    PT Stop Time 0143    PT Time Calculation (min) 40 min    Equipment Utilized During Treatment Other (comment)    Activity Tolerance Patient tolerated treatment well    Behavior During Therapy [facility] for tasks assessed/performed           Past Medical History:  Diagnosis Date  . Acid reflux   . Anxiety   . IBS (irritable bowel syndrome)   . Insomnia   . Osteopenia   . Osteoporosis   . Solitary cyst of breast     Past Surgical History:  Procedure Laterality Date  . BREAST BIOPSY Left 2001   Negative. 2 areas.   . CHOLECYSTECTOMY  03/2009  . COLONOSCOPY  2009   Dr Elliot/ hemorrhoids  . COLPOSCOPY  09/13/2002  . SHOULDER SURGERY  02/2018  . TUBAL LIGATION  10/04/1983    There were no vitals filed for this visit.   Subjective Assessment - 11/25/20 1309    Subjective Continuing to feel good overall. Some occassional popping sensation with hyperext, though she is trying to avoid these positions.    Pertinent History Pt is a 64 year old female familar with this clinic presenting with R shoulder ant dislocation 09/12/20 with relocation in ED. Imaging subsequently revealed bankhart lesion, and labrum tear; MD gave patient positive prognosis without surgical intervention. Pt reports aching pain 2/10, that is mostly just with pressure on it sleep on back side of the shoulder and collar bone at the end or the day, when she tries to sleep on it, lifts heavy, and reaches overhead. 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 Pt denies N/V, B&B changes, unexplained weight fluctuation, saddle paresthesia, fever, night sweats, or unrelenting night pain at this time.    Limitations House hold activities;Reading;Walking    How long can you sit comfortably? unlimited    How long can you stand comfortably? unlimited    How long can you walk comfortably? unlimited    Diagnostic tests Large Hill-Sachs impaction type injury involving the posterior aspect of the humeral head. Bony Bankart lesion also suspected Torn and partially detached anterior labrum (3:00 to 5:00 position). The anterior inferior articular cartilage of the glenoid is also abnormal, likely torn and partially detached.    Patient Stated Goals return to exercise regimen    Pain Onset In the past 7 days           Ther-Ex Nustep UE 9 seat 6 UE only L4 45mins for increased protraction/retraction strength  Seated abd/add 1# DB x10; 2# 2x 10  Figure 8 swings 2kg in pillow x30sec; kg 2x 30sec Diagonal band pull apart (RUE in scaption) 3x12 with min cuing initially to prevent trunk rotation with good carry over [employer] chest press 20# 3x 8 with min cuing for eccentric control with good carry over [place] throw toss (  chest press) at reboundeder 3x12 6.6# with no increased pain                          PT Education - 11/25/20 1320    Education provided Yes    Education Details therex form/technique    Person(s) Educated Patient    Methods Explanation;Demonstration;Verbal cues    Comprehension Verbalized understanding;Returned demonstration;Verbal cues required            PT Short Term Goals - 10/01/20 1550      PT SHORT TERM GOAL #1   Title  Pt will be independent with HEP in order to improve strength and ROM to improve function at home and work.    Baseline 10/01/20    Time 4     Period Weeks    Status New             PT Long Term Goals - 11/23/20 0909      PT LONG TERM GOAL #1   Title Patient will increase FOTO score to 73 to demonstrate predicted increase in functional mobility to complete ADLs    Baseline 10/01/20 58; 11/23/20 80    Time 8    Period Days    Status Achieved      PT LONG TERM GOAL #2   Title Pt will decrease worst pain as reported on NPRS to 0/10 in order to demonstrate clinically significant reduction in pain.    Baseline 10/01/20 2/10 pain; 11/23/20 Generally no pain, 2/10 in the mornings that quickly resolves    Time 6    Period Weeks    Status Partially Met      PT LONG TERM GOAL #3   Title Pt will demonstrate R lower trap strength of at least 4+ to demonstrate symmetrical PLOF    Baseline 10/01/20 3+/5; 11/29/214-/5    Time 8    Period Weeks    Status On-going      PT LONG TERM GOAL #4   Period Weeks                 Plan - 11/25/20 1328    Clinical Impression Statement PT continued therex progression for increased scapulohumeral rhythm and stability with success. Patient is able to complete all therex with proper technique following min cuing without pain. PT will continue progression as able.    Personal Factors and Comorbidities Age;Comorbidity 1;Comorbidity 2;Sex;Past/Current Experience    Comorbidities osteopenia, osteoporosis    Examination-Activity Limitations Bed Mobility;Reach Overhead;Lift;Carry;Sleep    Examination-Participation Restrictions Estate agent;Yard Work;Other    Stability/Clinical Decision Making Evolving/Moderate complexity    Clinical Decision Making Moderate    PT Treatment/Interventions Electrical Stimulation;Ultrasound;Cryotherapy;Moist Heat;Iontophoresis 4mg /ml Dexamethasone;Neuromuscular re-education;Passive range of motion;Manual techniques;Dry needling;Functional mobility training;Therapeutic activities;Patient/family education;Taping;Therapeutic exercise;Joint  Manipulations;Spinal Manipulations;Balance training;ADLs/Self Care Home Management    PT Next Visit Plan continue POC    PT Home Exercise Plan supine pec stretch, isometrics, scapular retractions    Consulted and Agree with Plan of Care Patient           Patient will benefit from skilled therapeutic intervention in order to improve the following deficits and impairments:  Increased fascial restricitons, Impaired sensation, Improper body mechanics, Pain, Postural dysfunction, Decreased mobility, Decreased endurance, Decreased range of motion, Decreased strength, Impaired UE functional use, Decreased activity tolerance, Decreased coordination, Hypermobility, Impaired flexibility, Impaired tone  Visit Diagnosis: Acute pain of right shoulder  Stiffness of right shoulder, not elsewhere classified  Problem List Patient Active Problem List   Diagnosis Date Noted  . Vaginal atrophy 02/21/2020  . Osteopenia 06/20/2017  . Adult idiopathic generalized osteoporosis 05/07/2015   Durwin Reges DPT  Durwin Reges 11/25/2020, 1:46 PM  [facility] [address], [place], [address] Phone: [phone]   Fax:  [phone]  Name: FRANCA STAKES MRN: [id number] Date of Birth: 1956-06-05

## 2020-11-26 ENCOUNTER — Ambulatory Visit: Payer: Medicare Other

## 2020-11-26 ENCOUNTER — Other Ambulatory Visit: Payer: Medicare Other

## 2020-12-11 ENCOUNTER — Ambulatory Visit: Payer: Medicare Other | Admitting: Physical Therapy

## 2020-12-16 ENCOUNTER — Encounter: Payer: Self-pay | Admitting: Physical Therapy

## 2020-12-16 ENCOUNTER — Ambulatory Visit: Payer: Medicare Other | Admitting: Physical Therapy

## 2020-12-16 ENCOUNTER — Other Ambulatory Visit: Payer: Self-pay

## 2020-12-16 DIAGNOSIS — M25511 Pain in right shoulder: Secondary | ICD-10-CM | POA: Diagnosis not present

## 2020-12-16 DIAGNOSIS — M25611 Stiffness of right shoulder, not elsewhere classified: Secondary | ICD-10-CM

## 2020-12-16 NOTE — Therapy (Signed)
[facility] PHYSICAL AND SPORTS MEDICINE [address], [place], [address] Phone: [phone]   Fax:  [phone]  Physical Therapy Treatment/Discharge Summary  Patient Details  Name: Susan Chaney MRN: [id number] Date of Birth: May 02, 1956 No data recorded  Encounter Date: 12/16/2020   PT End of Session - 12/16/20 0955    Visit Number 12    Number of Visits 17    Date for PT Re-Evaluation 11/27/20    PT Start Time 0955    PT Stop Time 1025    PT Time Calculation (min) 30 min    Equipment Utilized During Treatment Other (comment)    Activity Tolerance Patient tolerated treatment well    Behavior During Therapy [facility] for tasks assessed/performed           Past Medical History:  Diagnosis Date  . Acid reflux   . Anxiety   . IBS (irritable bowel syndrome)   . Insomnia   . Osteopenia   . Osteoporosis   . Solitary cyst of breast     Past Surgical History:  Procedure Laterality Date  . BREAST BIOPSY Left 2001   Negative. 2 areas.   . CHOLECYSTECTOMY  03/2009  . COLONOSCOPY  2009   Dr Elliot/ hemorrhoids  . COLPOSCOPY  09/13/2002  . SHOULDER SURGERY  02/2018  . TUBAL LIGATION  10/04/1983    There were no vitals filed for this visit.   Subjective Assessment - 12/16/20 0954    Subjective Feels good overall, ready to d/c PT.    Pertinent History Pt is a 64 year old female familar with this clinic presenting with R shoulder ant dislocation 09/12/20 with relocation in ED. Imaging subsequently revealed bankhart lesion, and labrum tear; MD gave patient positive prognosis without surgical intervention. Pt reports aching pain 2/10, that is mostly just with pressure on it sleep on back side of the shoulder and collar bone at the end or the day, when she tries to sleep on it, lifts heavy, and reaches overhead. 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 Pt denies N/V, B&B changes, unexplained weight fluctuation, saddle paresthesia, fever, night sweats, or unrelenting night pain at this time.    Limitations House hold activities;Reading;Walking    How long can you sit comfortably? unlimited    How long can you stand comfortably? unlimited    How long can you walk comfortably? unlimited    Diagnostic tests Large Hill-Sachs impaction type injury involving the posterior aspect of the humeral head. Bony Bankart lesion also suspected Torn and partially detached anterior labrum (3:00 to 5:00 position). The anterior inferior articular cartilage of the glenoid is also abnormal, likely torn and partially detached.    Patient Stated Goals return to exercise regimen    Pain Onset In the past 7 days              Ther-Ex Nustep UE 9seat 6 UE only L4 for increased protraction/retraction strength PT reviewed the following HEP with patient with patient able to demonstrate a set of the following with min cuing for correction needed. PT educated patient on parameters of therex (how/when to inc/decrease intensity, frequency, rep/set range, stretch hold time, and purpose of therex) with verbalized understanding.  Access Code: [email or web address] Prone Scapular Retraction Y - 1 x daily - 2-3 x weekly - 3 sets - 10  reps Wall Push Up - 1 x daily - 2-3 x weekly - 3 sets - 10 reps Standing High Row with Resistance - 1 x daily - 2-3 x weekly - 3 sets - 10 reps Standing Single Arm Shoulder External Rotation in Abduction with Anchored Resistance - 1 x daily - 2-3 x weekly - 3 sets - 10 reps Seated Overhead Press with Dumbbells - 1 x daily - 2-3 x weekly - 3 sets - 10 reps Doorway Pec Stretch at 60 Elevation - 1 x daily - 7 x weekly - 60sec hold                         PT Education - 12/16/20 0955    Education provided Yes    Education Details therex form/techniqued     Person(s) Educated Patient    Methods Explanation;Demonstration;Verbal cues    Comprehension Verbalized understanding;Returned demonstration;Verbal cues required            PT Short Term Goals - 12/16/20 0956      PT SHORT TERM GOAL #1   Title  Pt will be independent with HEP in order to improve strength and ROM to improve function at home and work.    Baseline 10/01/20 HEP given 12/16/20 completing HEP    Time 4    Period Weeks    Status Achieved             PT Long Term Goals - 12/16/20 0957      PT LONG TERM GOAL #1   Title Patient will increase FOTO score to 73 to demonstrate predicted increase in functional mobility to complete ADLs    Baseline 10/01/20 58; 11/23/20 80    Time 8    Period Days    Status Achieved      PT LONG TERM GOAL #2   Title Pt will decrease worst pain as reported on NPRS to 0/10 in order to demonstrate clinically significant reduction in pain.    Baseline 10/01/20 2/10 pain; 11/23/20 Generally no pain, 2/10 in the mornings that quickly resolves; 12/16/20 no pain    Time 6    Period Weeks    Status Achieved      PT LONG TERM GOAL #3   Title Pt will demonstrate R lower trap strength of at least 4+ to demonstrate symmetrical PLOF    Baseline 10/01/20 3+/5; 11/23/20 4-/5 12/16/20 5/5 bilat                 Plan - 12/16/20 1020    Clinical Impression Statement PT reassese goals this sessionwhere pt has meet all goals to safely d/c to HEP. Ptdemonstates verbalized and demonstrated understanding of HEP for maintenance of progress. Pt given clinic contact info should any further questions or concerns arise.    Personal Factors and Comorbidities Age;Comorbidity 1;Comorbidity 2;Sex;Past/Current Experience    Comorbidities osteopenia, osteoporosis    Examination-Activity Limitations Bed Mobility;Reach Overhead;Lift;Carry;Sleep    Examination-Participation Restrictions Psychiatric nurse;Yard Work;Other    Stability/Clinical  Decision Making Evolving/Moderate complexity    Clinical Decision Making Moderate    Rehab Potential Good    Clinical Impairments Affecting Rehab Potential (-) chronicity of sx, mulitple pain areas in L shoulder (+) motivation, active lifestyle, lack of other comorbidities    PT Frequency 2x / week    PT Duration 8 weeks    PT Treatment/Interventions Electrical Stimulation;Ultrasound;Cryotherapy;Moist Heat;Iontophoresis 4mg /ml Dexamethasone;Neuromuscular re-education;Passive range of motion;Manual techniques;Dry needling;Functional mobility training;Therapeutic activities;Patient/family education;Taping;Therapeutic exercise;Joint  Manipulations;Spinal Manipulations;Balance training;ADLs/Self Care Home Management    PT Next Visit Plan continue POC    PT Home Exercise Plan supine pec stretch, isometrics, scapular retractions    Consulted and Agree with Plan of Care Patient           Patient will benefit from skilled therapeutic intervention in order to improve the following deficits and impairments:  Increased fascial restricitons,Impaired sensation,Improper body mechanics,Pain,Postural dysfunction,Decreased mobility,Decreased endurance,Decreased range of motion,Decreased strength,Impaired UE functional use,Decreased activity tolerance,Decreased coordination,Hypermobility,Impaired flexibility,Impaired tone  Visit Diagnosis: Acute pain of right shoulder  Stiffness of right shoulder, not elsewhere classified     Problem List Patient Active Problem List   Diagnosis Date Noted  . Vaginal atrophy 02/21/2020  . Osteopenia 06/20/2017  . Adult idiopathic generalized osteoporosis 05/07/2015   Hilda Lias DPT Hilda Lias 12/16/2020, 10:22 AM  [facility] PHYSICAL AND SPORTS MEDICINE [address], [place], [address] Phone: [phone]   Fax:  [phone]  Name: Susan Chaney MRN: [id number] Date of Birth: [birth year]

## 2021-07-06 DIAGNOSIS — Z0000 Encounter for general adult medical examination without abnormal findings: Secondary | ICD-10-CM | POA: Insufficient documentation

## 2021-08-11 ENCOUNTER — Encounter: Payer: Self-pay | Admitting: Podiatry

## 2021-08-11 ENCOUNTER — Other Ambulatory Visit: Payer: Self-pay | Admitting: Podiatry

## 2021-08-11 ENCOUNTER — Ambulatory Visit (INDEPENDENT_AMBULATORY_CARE_PROVIDER_SITE_OTHER): Payer: Medicare Other | Admitting: Podiatry

## 2021-08-11 ENCOUNTER — Ambulatory Visit (INDEPENDENT_AMBULATORY_CARE_PROVIDER_SITE_OTHER): Payer: Medicare Other

## 2021-08-11 ENCOUNTER — Other Ambulatory Visit: Payer: Self-pay

## 2021-08-11 DIAGNOSIS — M2012 Hallux valgus (acquired), left foot: Secondary | ICD-10-CM

## 2021-08-11 DIAGNOSIS — M2010 Hallux valgus (acquired), unspecified foot: Secondary | ICD-10-CM

## 2021-08-11 MED ORDER — TERBINAFINE HCL 250 MG PO TABS: 250.0000 mg | ORAL_TABLET | Freq: Every day | ORAL | 0 refills | Status: AC

## 2021-08-11 NOTE — Progress Notes (Signed)
She presents today would like to consider bunion surgery for her left foot.  She states that is starting to affect my ability perform her daily activities and the right 1 is fixed so well I will like to consider fixing the left 1.  Objective: Vital signs are stable she is alert oriented x3 have reviewed her past medical history medications allergies surgery social history review of systems.  Pulses are strongly palpable neurologic sensorium is intact Deetjen reflexes are intact muscle strength normal symmetrical.  Orthopedic evaluation straits all joints distal ankle full range of motion without crepitation.  The first TMT joint appears to be hypermobile with a flexible bunion deformity.  Radiographs reviewed demonstrate an increase in the first intermetatarsal angle greater than normal value and hallux abductus angle greater than normal value.  Assessment: Hallux abductovalgus deformity left foot.  Plan: At this point I discussed with her in great detail the need for a Lapidus procedure.  However she does not want perform a Lapidus procedure but she would like to go ahead and have a capital osteotomy performed.  I expressed to her that there is a high likelihood of recurrence with a capital osteotomy as opposed to a Lapidus procedure she states that she understands that and is amendable to it and willing to take that risk.  She states that she expects me to do everything that I can do surgery to prevent that from happening.  I expressed to her that once again that is very hard to make sure that this does not happen she understands and is amenable to it and is willing to take that chance.  So we consented her today for an [facility] bunion repair with a abductor tendon transfer.  I will educate her on this type of procedure thoroughly and we discussed once again today possible complications which may include but not limited to postop pain bleeding swelling infection recurrence need for further surgery  overcorrection under correction loss of digit loss of limb loss of life.  Dispensed cam walker for her today information regarding the surgery center and anesthesia group I will follow-up with her in the near future for surgical intervention.

## 2021-10-21 ENCOUNTER — Other Ambulatory Visit: Payer: Self-pay | Admitting: Internal Medicine

## 2021-10-21 DIAGNOSIS — Z1231 Encounter for screening mammogram for malignant neoplasm of breast: Secondary | ICD-10-CM

## 2021-11-01 ENCOUNTER — Telehealth: Payer: Self-pay

## 2021-11-01 NOTE — Telephone Encounter (Signed)
[place] called to cancel her surgery with Dr. Al Corpus on 11/26/2021. She stated she wants to wait till next year to have her surgery. She stated she will call me back when she is ready to reschedule. Notified Dr. Al Corpus and Aram Beecham with [facility]

## 2021-11-16 ENCOUNTER — Other Ambulatory Visit: Payer: Self-pay

## 2021-11-16 ENCOUNTER — Ambulatory Visit
Admission: RE | Admit: 2021-11-16 | Discharge: 2021-11-16 | Disposition: A | Discharge status: Home / Self Care | Payer: Medicare Other | Source: Ambulatory Visit | Attending: Internal Medicine | Admitting: Internal Medicine

## 2021-11-16 DIAGNOSIS — Z1231 Encounter for screening mammogram for malignant neoplasm of breast: Secondary | ICD-10-CM | POA: Insufficient documentation

## 2021-12-01 ENCOUNTER — Encounter: Payer: Medicare Other | Admitting: Podiatry

## 2021-12-08 ENCOUNTER — Encounter: Payer: Medicare Other | Admitting: Podiatry

## 2021-12-22 ENCOUNTER — Encounter: Payer: Medicare Other | Admitting: Podiatry

## 2022-01-05 ENCOUNTER — Encounter: Payer: Medicare Other | Admitting: Podiatry

## 2022-10-10 ENCOUNTER — Other Ambulatory Visit: Payer: Self-pay | Admitting: Internal Medicine

## 2022-10-10 DIAGNOSIS — Z1231 Encounter for screening mammogram for malignant neoplasm of breast: Secondary | ICD-10-CM

## 2022-11-10 IMAGING — MG DIGITAL SCREENING BILAT W/ TOMO W/ CAD
6 of 10 series · 6 of 30 positions shown · non-contrast
Comparison: Previous exam(s).

CLINICAL DATA: Screening.

EXAM:
DIGITAL SCREENING BILATERAL MAMMOGRAM WITH TOMO AND CAD

[L MLO synth-2D]
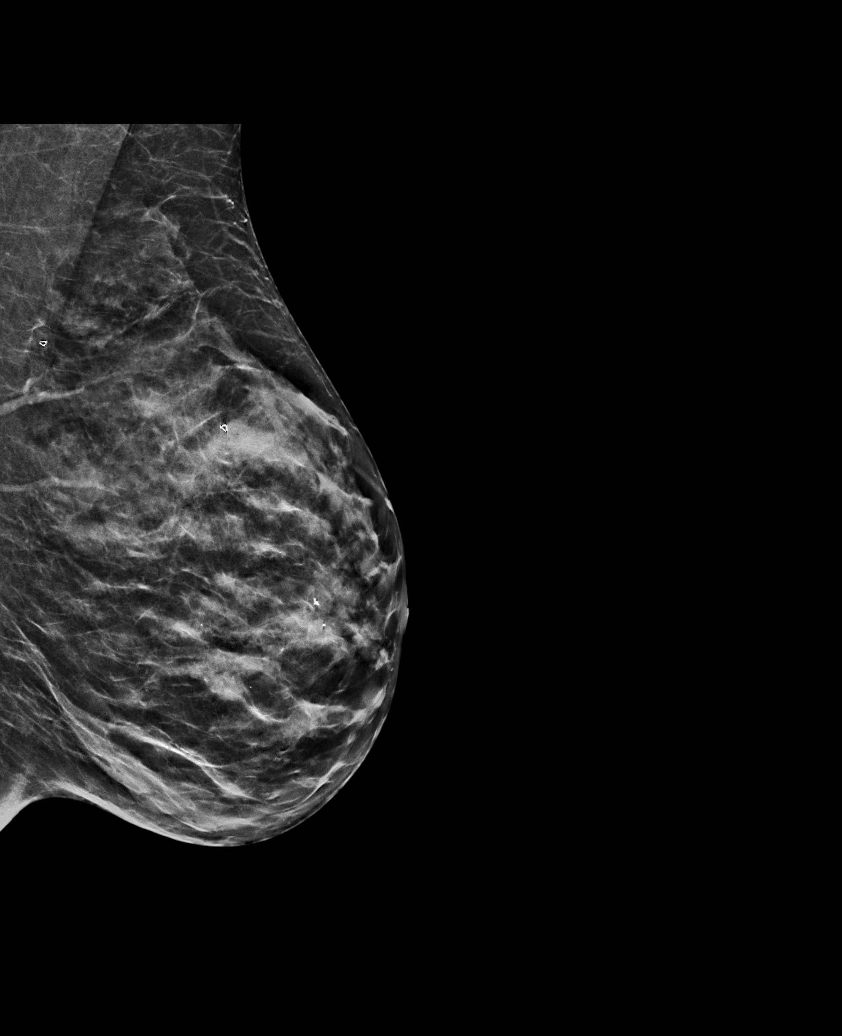

[R CC synth-2D (1 of 2)]
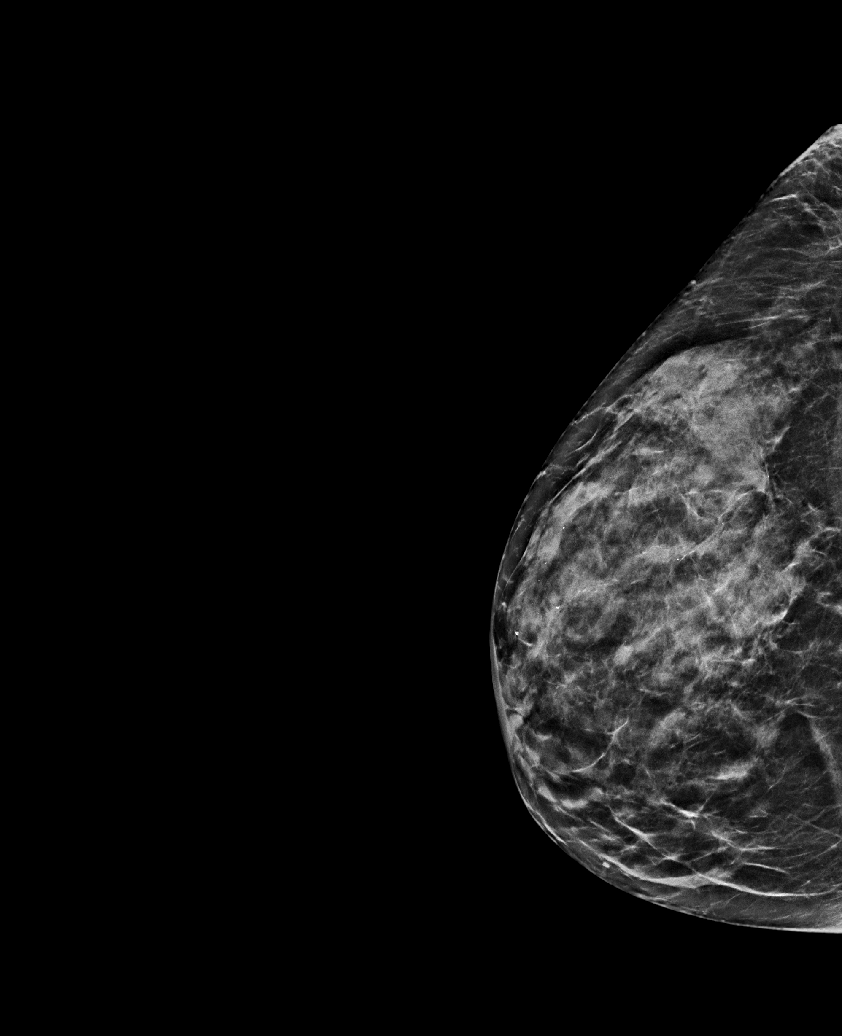

[R CC synth-2D (2 of 2)]
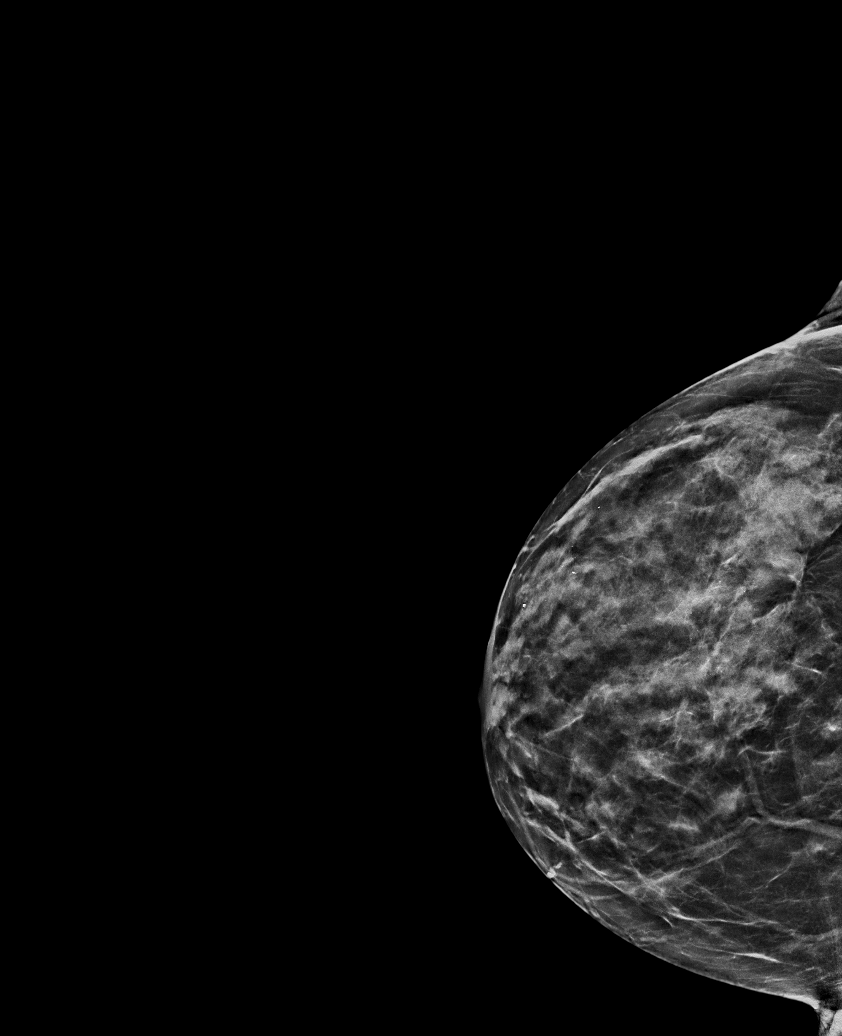

[R MLO synth-2D]
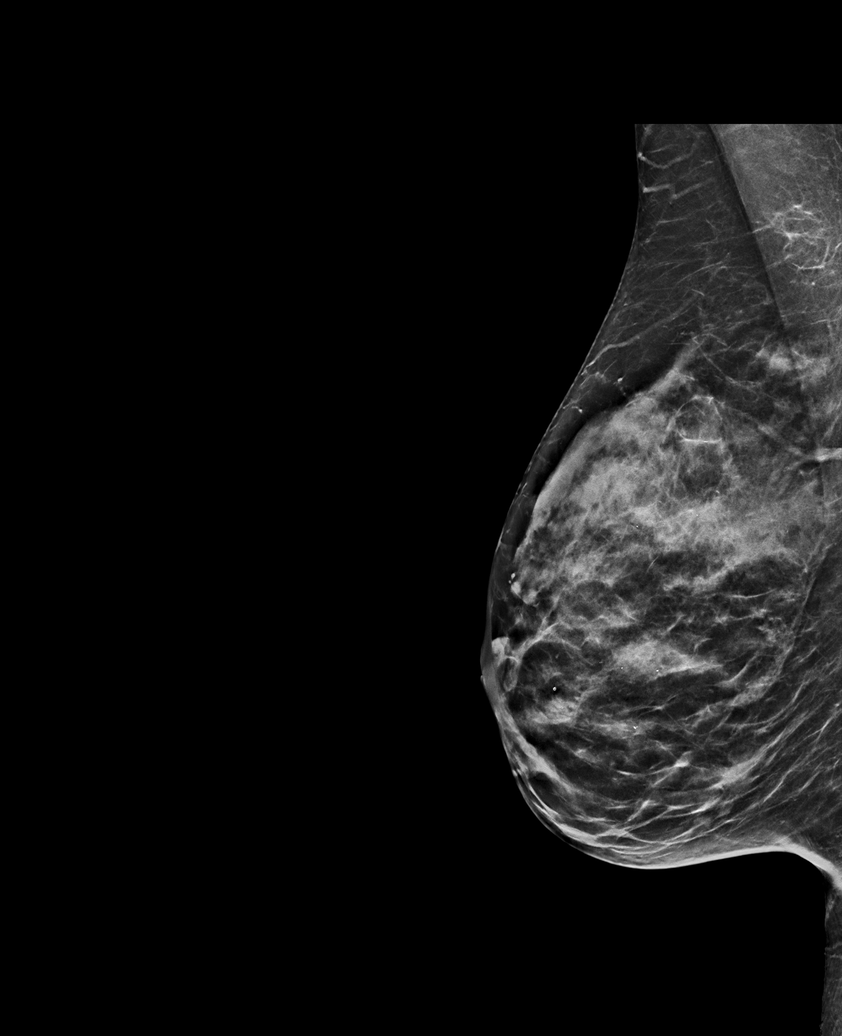

[L CC synth-2D]
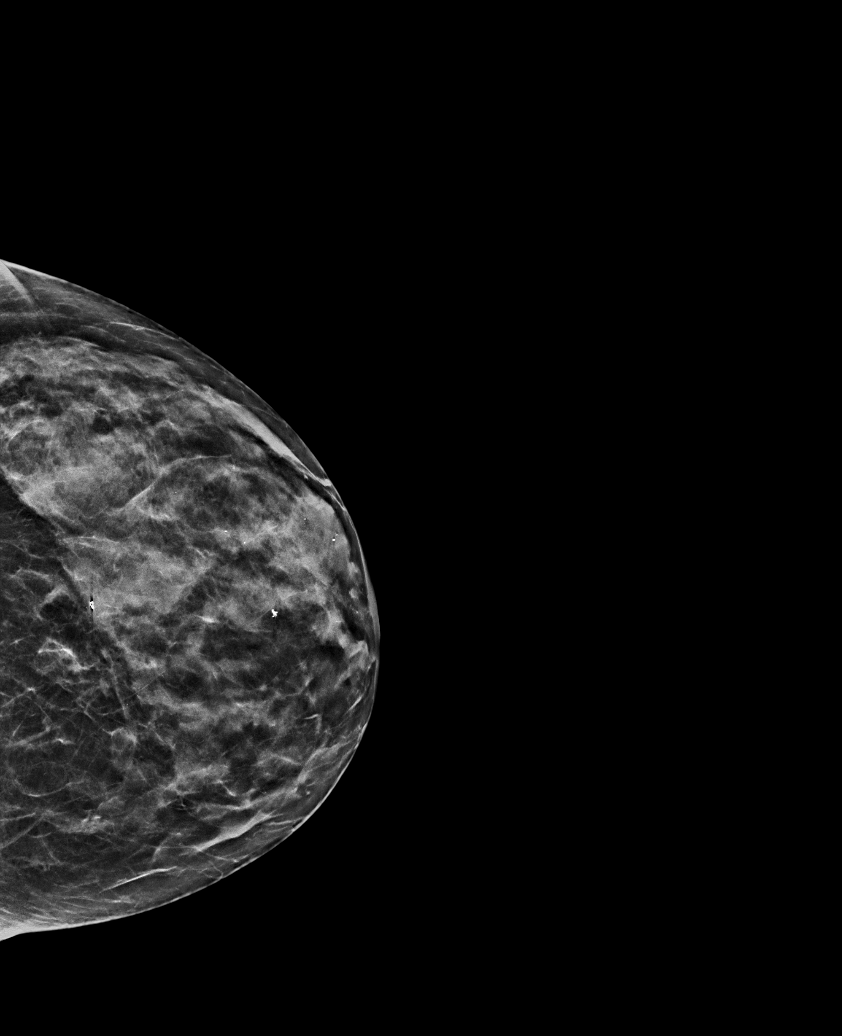

[R MLO tomo · tomo slice 31/60.0]
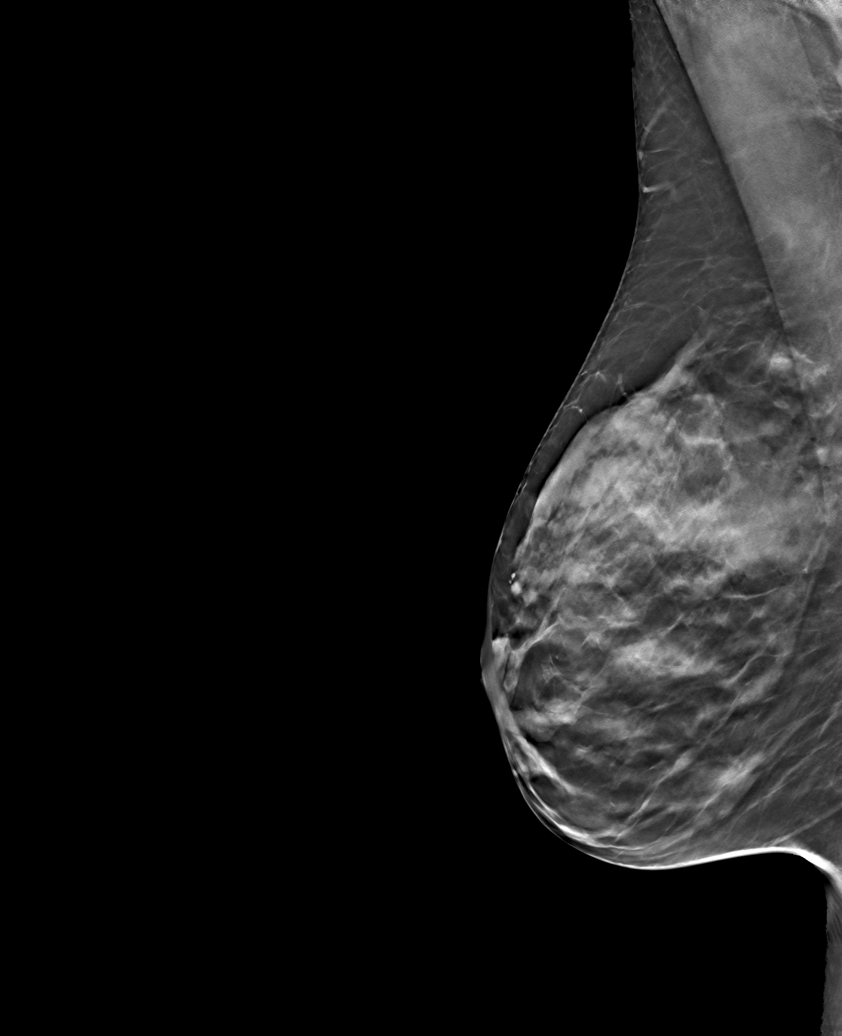

[6 of 30 positions shown; findings below may reference images not displayed]

ACR Breast Density Category c: The breast tissue is heterogeneously
dense, which may obscure small masses.
FINDINGS: In the right breast, a possible asymmetry warrants further
evaluation. In the left breast, no findings suspicious for
malignancy. Images were processed with CAD.
IMPRESSION: Further evaluation is suggested for possible asymmetry in the right
breast.

RECOMMENDATION:
Diagnostic mammogram and possibly ultrasound of the right breast.
(Code:[id number])

The patient will be contacted regarding the findings, and additional
imaging will be scheduled.

BI-RADS CATEGORY  0: Incomplete. Need additional imaging evaluation
and/or prior mammograms for comparison.

## 2023-01-31 ENCOUNTER — Ambulatory Visit
Admission: RE | Admit: 2023-01-31 | Discharge: 2023-01-31 | Disposition: A | Discharge status: Home / Self Care | Payer: Medicare Other | Source: Ambulatory Visit | Attending: Internal Medicine | Admitting: Internal Medicine

## 2023-01-31 DIAGNOSIS — Z1231 Encounter for screening mammogram for malignant neoplasm of breast: Secondary | ICD-10-CM | POA: Insufficient documentation

## 2023-10-17 ENCOUNTER — Other Ambulatory Visit: Payer: Self-pay | Admitting: Internal Medicine

## 2023-10-17 DIAGNOSIS — Z1231 Encounter for screening mammogram for malignant neoplasm of breast: Secondary | ICD-10-CM

## 2023-12-18 ENCOUNTER — Encounter: Payer: Self-pay | Admitting: Internal Medicine

## 2023-12-18 DIAGNOSIS — Z1231 Encounter for screening mammogram for malignant neoplasm of breast: Secondary | ICD-10-CM

## 2024-01-08 ENCOUNTER — Other Ambulatory Visit: Payer: Self-pay | Admitting: Internal Medicine

## 2024-01-08 DIAGNOSIS — Z1231 Encounter for screening mammogram for malignant neoplasm of breast: Secondary | ICD-10-CM

## 2024-02-13 ENCOUNTER — Ambulatory Visit
Admission: RE | Admit: 2024-02-13 | Discharge: 2024-02-13 | Disposition: A | Discharge status: Home / Self Care | Payer: Medicare Other | Source: Ambulatory Visit | Attending: Internal Medicine | Admitting: Internal Medicine

## 2024-02-13 DIAGNOSIS — Z1231 Encounter for screening mammogram for malignant neoplasm of breast: Secondary | ICD-10-CM | POA: Insufficient documentation

## 2024-07-26 ENCOUNTER — Other Ambulatory Visit: Payer: Self-pay | Admitting: Internal Medicine

## 2024-07-26 DIAGNOSIS — E782 Mixed hyperlipidemia: Secondary | ICD-10-CM

## 2024-07-26 DIAGNOSIS — Z0000 Encounter for general adult medical examination without abnormal findings: Secondary | ICD-10-CM

## 2024-08-14 ENCOUNTER — Ambulatory Visit
Admission: RE | Admit: 2024-08-14 | Discharge: 2024-08-14 | Disposition: A | Discharge status: Home / Self Care | Payer: Self-pay | Source: Ambulatory Visit | Attending: Internal Medicine | Admitting: Internal Medicine

## 2024-08-14 DIAGNOSIS — E782 Mixed hyperlipidemia: Secondary | ICD-10-CM | POA: Insufficient documentation

## 2024-08-14 DIAGNOSIS — Z0000 Encounter for general adult medical examination without abnormal findings: Secondary | ICD-10-CM | POA: Insufficient documentation

## 2024-08-29 ENCOUNTER — Encounter: Payer: Self-pay | Admitting: Podiatry

## 2024-08-29 ENCOUNTER — Ambulatory Visit (INDEPENDENT_AMBULATORY_CARE_PROVIDER_SITE_OTHER): Admitting: Podiatry

## 2024-08-29 ENCOUNTER — Ambulatory Visit (INDEPENDENT_AMBULATORY_CARE_PROVIDER_SITE_OTHER)

## 2024-08-29 VITALS — Ht 63.0 in | Wt 113.0 lb

## 2024-08-29 DIAGNOSIS — M21622 Bunionette of left foot: Secondary | ICD-10-CM | POA: Diagnosis not present

## 2024-08-29 DIAGNOSIS — M2012 Hallux valgus (acquired), left foot: Secondary | ICD-10-CM | POA: Diagnosis not present

## 2024-08-29 DIAGNOSIS — M7752 Other enthesopathy of left foot: Secondary | ICD-10-CM

## 2024-08-29 NOTE — Progress Notes (Signed)
 Subjective:  Patient ID: Susan Chaney, female    DOB: 1956/12/16,  MRN: [id number]  Chief Complaint  Patient presents with   Bunions    Rm 7 Patient is here for bunion on the left foot and possible hammertoe of the left index  toe.    Discussed the use of AI scribe software for clinical note transcription with the patient, who gave verbal consent to proceed.  History of Present Illness Susan Chaney is a 68 year old female who presents with foot pain and deformity.  She has had a bunion and hammer toe on her left foot for several years, causing discomfort, especially when wearing certain types of shoes. Pain is due to pressure over the bunion, and she has developed a callus in the area. Her right foot was previously treated for similar issues nine years ago, which resolved the problem on that side.  There is a family history of foot problems, with her father having severe bunions and toe deformities. She is concerned about her foot condition worsening with age, as she recently turned [age].  She has a history of toenail fungus, persistent despite multiple treatments, including oral Lamisil , laser therapy, and toenail removal. She experienced a loss of taste while on Lamisil  and discontinued it. The fungus has caused her toenails to become thick and unsightly, which she finds embarrassing.  She has a history of osteoporosis diagnosed approximately ten years ago, treated with Fosamax. Her condition improved to osteopenia, but recent bone density tests showed some areas of concern, leading to a continuation of treatment with alendronate once a week. She also takes a multivitamin with D3.  No pain in the second toe unless there is pressure from shoes. No pain over the Taylor's bunion area unless from shoe pressure.      Objective:    Physical Exam VASCULAR: DP and PT pulse palpable. Foot is warm and well-perfused. Capillary fill time is brisk. DERMATOLOGIC: Normal skin turgor,  texture, and temperature. No open lesions, rashes, or ulcerations. NEUROLOGIC: Normal sensation to light touch and pressure. No paresthesias on examination. ORTHOPEDIC: Moderate to severe hallux valgus deformity on the right with good range of motion of the first MTP joint, palpable dorsal medial bunion. Significant hypermobility at the first partial metatarsal joint. Tailor's bunion deformity noted. Reducible hammer toe contraction of the second toe. Smooth pain-free range of motion of all examined joints. No ecchymosis or bruising. No gross deformity. No pain to palpation.   No images are attached to the encounter.    Results RADIOLOGY Left foot radiograph: Tailor's bunion deformity, moderate to severe hallux valgus deformity with a large increase in the intermetatarsal angle and frontal plane rotation, dorsal medial bunion, mild degenerative change in the metatarsophalangeal (MTP) joint (08/29/2024)   Assessment:   1. Hallux valgus, left   2. Tailor's bunion of left foot      Plan:  Patient was evaluated and treated and all questions answered.  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 Chronic condition with pain primarily due to pressure from certain footwear. Osteoporosis, managed with alendronate, may affect bone healing. - We discussed nonoperative and operative treatment.  Thus far she has made nonoperative treatment recommendations that I would agree with with wider shoe gears and  anti-inflammatories as needed these been unsuccessful in reducing her pain and improving her function.  Surgical we discussed Lapidus bunionectomy and Akin osteotomy as well as a tailor's bunion fifth metatarsal osteotomy for left foot  to correct the bunion by fusing the joint in the arch and realigning the metatarsal. Procedure involves cutting and removing a wedge of bone and may include additional cuts to correct toe drift. Low recurrence rate of less than 3%. Risks include 10% chance of delayed bone healing and less than 1% chance of requiring revision surgery. New technology allows earlier mobility post-surgery.   Left Tailor's bunion (bunionette) Presence of a Tailor's bunion on the left foot, contributing to a wider forefoot. Not currently causing significant pain but considered for correction during planned surgery to narrow the forefoot. - Include correction of Tailor's bunion in surgical plan. Correction involves bone cut and medial translation with percutaneous wire fixation, removed when sutures are taken out. Will not add to recovery time.  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 Occasionally causes pain if something rubs against it or with certain shoes, but not constant or severe. Condition may worsen over time, especially after bunion correction. - Advise against immediate intervention unless it becomes painful or problematic in the future.  Onychomycosis (toenail fungus) Chronic toenail fungus with history of multiple treatments including Lamisil , laser therapy, and nail removal, all with temporary improvement. Experienced loss of taste with Lamisil , leading to discontinuation. Condition likely due to both fungal infection and physical damage to the nail bed. No perfect solution exists due to nail bed damage. - Consider alternative oral antifungals such as Sporanox or fluconazole, acknowledging potential side effects.    Surgical plan:  Procedure: - Left foot Lapidus, Akin, tailor's bunion osteotomy  Location: - [facility]  Anesthesia plan: - Sedation with regional block  Postoperative pain plan: - Tylenol  1000 mg every 6 hours,  ibuprofen 600 mg every 6 hours, gabapentin 300 mg every 8 hours x5 days, oxycodone  5 mg 1-2 tabs every 6 hours only as needed  DVT prophylaxis: - None required  WB Restrictions / DME needs: - Nonweightbearing in CAM boot postop    No follow-ups on file.

## 2024-09-09 ENCOUNTER — Telehealth: Payer: Self-pay | Admitting: Podiatry

## 2024-09-09 NOTE — Telephone Encounter (Signed)
 RECEIVED SURGICAL CONSENT FORM   Left message for pt to call to get the surgery scheduled with Dr Silva

## 2024-09-20 NOTE — Telephone Encounter (Signed)
 Pt scheduled for surgery for 11/08/24.  Not on blood thinners or glp1 medications.pharmacy correct in chart. She did ask about post op appts and I explained Dr Silva does 1 wk after surgery, then 2 weeks after that and then 3 wks after 2nd pov. She asked if stitches will be removed at 1st and I told her usually it is the 2nd but it would be the doctors decision.

## 2024-09-26 ENCOUNTER — Telehealth: Payer: Self-pay | Admitting: Podiatry

## 2024-09-26 NOTE — Telephone Encounter (Signed)
 DOS- 11/08/2024  AIKEN OSTEOTOMY LT- 71689 LAPIDUS PROCEDURE INCLUDING BUNIONECTOMY LT- 28297 METATARSAL OSTEOTOMY LT- 71691  [employer] EFFECTIVE DATE- 12/27/2023  DEDUCTIBLE- N/A OOP- $3500 REMAINING- $3300 COINSURANCE- $3500 REMAINING- $3300  PER [employer] PORTAL, NO PRIOR AUTHS ARE REQUIRED FOR CPT CODES 71689, [id number], AND 28308. DOCUMENTATION ATTACHED TO SURGERY CONSENT PACKET.

## 2024-11-08 ENCOUNTER — Other Ambulatory Visit: Payer: Self-pay | Admitting: Podiatry

## 2024-11-08 ENCOUNTER — Encounter: Payer: Self-pay | Admitting: Podiatry

## 2024-11-08 DIAGNOSIS — M21622 Bunionette of left foot: Secondary | ICD-10-CM | POA: Diagnosis not present

## 2024-11-08 DIAGNOSIS — M2012 Hallux valgus (acquired), left foot: Secondary | ICD-10-CM | POA: Diagnosis not present

## 2024-11-08 MED ORDER — ASPIRIN 81 MG PO TBEC: 81.0000 mg | DELAYED_RELEASE_TABLET | Freq: Two times a day (BID) | ORAL | 0 refills | Status: AC

## 2024-11-08 MED ORDER — ACETAMINOPHEN 500 MG PO TABS: 1000.0000 mg | ORAL_TABLET | Freq: Four times a day (QID) | ORAL | 0 refills | Status: DC | PRN

## 2024-11-08 MED ORDER — IBUPROFEN 600 MG PO TABS: 600.0000 mg | ORAL_TABLET | Freq: Three times a day (TID) | ORAL | 0 refills | Status: DC | PRN

## 2024-11-08 MED ORDER — OXYCODONE HCL 5 MG PO TABS: 5.0000 mg | ORAL_TABLET | ORAL | 0 refills | Status: AC | PRN

## 2024-11-08 MED ORDER — DOCUSATE SODIUM 100 MG PO CAPS: 100.0000 mg | ORAL_CAPSULE | Freq: Two times a day (BID) | ORAL | 0 refills | Status: AC

## 2024-11-08 MED ORDER — OXYCODONE HCL 5 MG PO TABS
5.0000 mg | ORAL_TABLET | ORAL | 0 refills | Status: DC | PRN
Start: 2024-11-08 — End: 2024-11-08

## 2024-11-08 MED ORDER — DOCUSATE SODIUM 100 MG PO CAPS: 100.0000 mg | ORAL_CAPSULE | Freq: Two times a day (BID) | ORAL | 0 refills | Status: DC

## 2024-11-08 MED ORDER — ACETAMINOPHEN 500 MG PO TABS: 1000.0000 mg | ORAL_TABLET | Freq: Four times a day (QID) | ORAL | 0 refills | Status: AC | PRN

## 2024-11-08 MED ORDER — ASPIRIN 81 MG PO TBEC: 81.0000 mg | DELAYED_RELEASE_TABLET | Freq: Two times a day (BID) | ORAL | 0 refills | Status: DC

## 2024-11-08 MED ORDER — IBUPROFEN 600 MG PO TABS: 600.0000 mg | ORAL_TABLET | Freq: Three times a day (TID) | ORAL | 0 refills | Status: AC | PRN

## 2024-11-13 ENCOUNTER — Ambulatory Visit (INDEPENDENT_AMBULATORY_CARE_PROVIDER_SITE_OTHER)

## 2024-11-13 ENCOUNTER — Ambulatory Visit (INDEPENDENT_AMBULATORY_CARE_PROVIDER_SITE_OTHER): Admitting: Podiatry

## 2024-11-13 VITALS — Ht 63.0 in | Wt 113.0 lb

## 2024-11-13 DIAGNOSIS — M2012 Hallux valgus (acquired), left foot: Secondary | ICD-10-CM

## 2024-11-13 NOTE — Progress Notes (Signed)
  Subjective:  Patient ID: Susan Chaney, female    DOB: [birth year],  MRN: [id number]  Chief Complaint  Patient presents with   Post-op Follow-up    RM  POV # 1 DOS 11/08/24 T FOOT BUNION CORRECTION W/ LAPIDUS PROCEDURE, BONE CUT IN BIG TOE, TAILORS BUNION CORRECTION. Sutures and pin intact with moderate swelling.     68 y.o. female returns for post-op check.  Doing well pain is controlled  Review of Systems: Negative except as noted in the HPI. Denies N/V/F/Ch.   Objective:  There were no vitals filed for this visit. Body mass index is 20.02 kg/m. Constitutional Well developed. Well nourished.  Vascular Foot warm and well perfused. Capillary refill normal to all digits.  Calf is soft and supple, no posterior calf or knee pain, negative Homans' sign  Neurologic Normal speech. Oriented to person, place, and time. Epicritic sensation to light touch grossly present bilaterally.  Dermatologic Skin healing well without signs of infection. Skin edges well coapted without signs of infection.  Orthopedic: Tenderness to palpation noted about the surgical site.   Multiple view plain film radiographs: Good correction noted, all hardware intact and equivalent to immediate postoperative films Assessment:   1. Hallux valgus, left    Plan:  Patient was evaluated and treated and all questions answered.  S/p foot surgery left -Progressing as expected post-operatively. -XR: Noted above -WB Status: Nonweightbearing in CAM boot -Sutures: Return in 2 weeks for suture removal and removal (. -Medications: No refills needed -Foot redressed.  Okay to remove Monday and wash foot  No follow-ups on file.

## 2024-11-27 ENCOUNTER — Ambulatory Visit (INDEPENDENT_AMBULATORY_CARE_PROVIDER_SITE_OTHER): Admitting: Podiatry

## 2024-11-27 DIAGNOSIS — M21622 Bunionette of left foot: Secondary | ICD-10-CM

## 2024-11-27 DIAGNOSIS — M2012 Hallux valgus (acquired), left foot: Secondary | ICD-10-CM

## 2024-11-27 NOTE — Progress Notes (Signed)
  Subjective:  Patient ID: Susan  CHRISTELLA Chaney, female    DOB: 08-Oct-1956,  MRN: [id number]  Chief Complaint  Patient presents with   Routine Post Op    POV # 2 DOS 11/08/24 T FOOT BUNION CORRECTION W/ LAPIDUS PROCEDURE, BONE CUT IN BIG TOE, TAILORS BUNION CORRECTION. She is still using ice and can't put full weight on her foot. Concerned about progress.      68 y.o. female returns for post-op check.  Doing well pain is controlled, has some concerns she can walk much yet  Review of Systems: Negative except as noted in the HPI. Denies N/V/F/Ch.   Objective:  There were no vitals filed for this visit. There is no height or weight on file to calculate BMI. Constitutional Well developed. Well nourished.  Vascular Foot warm and well perfused. Capillary refill normal to all digits.  Calf is soft and supple, no posterior calf or knee pain, negative Homans' sign  Neurologic Normal speech. Oriented to person, place, and time. Epicritic sensation to light touch grossly present bilaterally.  Dermatologic Skin healing well without signs of infection. Skin edges well coapted without signs of infection.  Orthopedic: Tenderness to palpation noted about the surgical site.   Multiple view plain film radiographs: Good correction noted, all hardware intact and equivalent to immediate postoperative films Assessment:   1. Hallux valgus, left   2. Tailor's bunion of left foot    Plan:  Patient was evaluated and treated and all questions answered.  S/p foot surgery left Suture removed uneventfully.  Pin removed uneventfully.  May bathe and wash foot regularly.  Continue range of motion of the toe.  Compression sleeve dispensed to utilize for edema reduction.  Wean weightbearing as tolerated gradually in the cam walker boot which we discussed will be a process.  Follow-up in 3 weeks for new x-rays.  No follow-ups on file.

## 2024-12-13 ENCOUNTER — Ambulatory Visit

## 2024-12-13 DIAGNOSIS — M2012 Hallux valgus (acquired), left foot: Secondary | ICD-10-CM

## 2024-12-13 DIAGNOSIS — Z9889 Other specified postprocedural states: Secondary | ICD-10-CM

## 2024-12-13 DIAGNOSIS — M21622 Bunionette of left foot: Secondary | ICD-10-CM

## 2024-12-13 MED ORDER — DOXYCYCLINE HYCLATE 100 MG PO TABS: 100.0000 mg | ORAL_TABLET | Freq: Two times a day (BID) | ORAL | 0 refills | Status: AC

## 2024-12-13 NOTE — Progress Notes (Unsigned)
 Left foot doing well, small dehiscence. Doxy.

## 2024-12-17 ENCOUNTER — Encounter: Admitting: Podiatry

## 2024-12-18 ENCOUNTER — Encounter: Admitting: Podiatry

## 2024-12-23 ENCOUNTER — Telehealth: Payer: Self-pay | Admitting: Lab

## 2024-12-23 MED ORDER — DOXYCYCLINE HYCLATE 100 MG PO TABS: 100.0000 mg | ORAL_TABLET | Freq: Two times a day (BID) | ORAL | 0 refills | Status: DC

## 2024-12-23 MED ORDER — GENTAMICIN SULFATE 0.1 % EX OINT: 1.0000 | TOPICAL_OINTMENT | Freq: Every day | CUTANEOUS | 0 refills | Status: DC

## 2024-12-23 NOTE — Telephone Encounter (Signed)
 Patient states she still dealing with infection at incision site and would like to know what to do as her appointment is another week out please advise.

## 2024-12-23 NOTE — Addendum Note (Signed)
 Addended byBETHA MEDICINE, Treysean Petruzzi R on: 12/23/2024 05:33 PM   Modules accepted: Orders

## 2024-12-24 MED ORDER — DOXYCYCLINE HYCLATE 100 MG PO TABS: 100.0000 mg | ORAL_TABLET | Freq: Two times a day (BID) | ORAL | 0 refills | Status: AC

## 2024-12-24 MED ORDER — GENTAMICIN SULFATE 0.1 % EX OINT: 1.0000 | TOPICAL_OINTMENT | Freq: Every day | CUTANEOUS | 0 refills | Status: AC

## 2024-12-24 NOTE — Addendum Note (Signed)
 Addended byBETHA MEDICINE, Tavaughn Silguero R on: 12/24/2024 05:26 PM   Modules accepted: Orders

## 2024-12-24 NOTE — Addendum Note (Signed)
 Addended byBETHA MEDICINE, Jarek Longton R on: 12/24/2024 12:03 PM   Modules accepted: Orders

## 2025-01-03 ENCOUNTER — Ambulatory Visit

## 2025-01-03 DIAGNOSIS — M2012 Hallux valgus (acquired), left foot: Secondary | ICD-10-CM

## 2025-01-03 DIAGNOSIS — Z9889 Other specified postprocedural states: Secondary | ICD-10-CM

## 2025-01-03 DIAGNOSIS — M21622 Bunionette of left foot: Secondary | ICD-10-CM

## 2025-01-03 NOTE — Progress Notes (Unsigned)
 Doing well post op. Transition to standard tennis shoe

## 2025-01-16 ENCOUNTER — Other Ambulatory Visit: Payer: Self-pay | Admitting: Internal Medicine

## 2025-01-16 DIAGNOSIS — Z1231 Encounter for screening mammogram for malignant neoplasm of breast: Secondary | ICD-10-CM

## 2025-01-23 ENCOUNTER — Other Ambulatory Visit: Payer: Self-pay | Admitting: Internal Medicine

## 2025-01-23 DIAGNOSIS — R911 Solitary pulmonary nodule: Secondary | ICD-10-CM

## 2025-02-11 ENCOUNTER — Ambulatory Visit: Admitting: Podiatry

## 2025-02-12 ENCOUNTER — Ambulatory Visit

## 2025-03-12 ENCOUNTER — Encounter
